# Patient Record
Sex: Female | Born: 2000 | Race: Black or African American | Hispanic: No | Marital: Single | Smoking: Never smoker
Health system: Southern US, Community
[De-identification: ages and names within clinical notes are randomized; demographics above are authoritative.]

## PROBLEM LIST (undated history)

## (undated) ENCOUNTER — Inpatient Hospital Stay (HOSPITAL_COMMUNITY): Payer: Self-pay

## (undated) DIAGNOSIS — D509 Iron deficiency anemia, unspecified: Secondary | ICD-10-CM

## (undated) DIAGNOSIS — Z9109 Other allergy status, other than to drugs and biological substances: Secondary | ICD-10-CM

## (undated) DIAGNOSIS — K5900 Constipation, unspecified: Secondary | ICD-10-CM

## (undated) HISTORY — DX: Iron deficiency anemia, unspecified: D50.9

## (undated) HISTORY — DX: Constipation, unspecified: K59.00

## (undated) HISTORY — PX: NO PAST SURGERIES: SHX2092

---
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
## 2020-01-22 ENCOUNTER — Ambulatory Visit (INDEPENDENT_AMBULATORY_CARE_PROVIDER_SITE_OTHER): Payer: Medicaid Other | Admitting: *Deleted

## 2020-01-22 ENCOUNTER — Other Ambulatory Visit: Payer: Self-pay

## 2020-01-22 DIAGNOSIS — Z3042 Encounter for surveillance of injectable contraceptive: Secondary | ICD-10-CM | POA: Diagnosis not present

## 2020-01-22 MED ORDER — MEDROXYPROGESTERONE ACETATE 150 MG/ML IM SUSP
150.0000 mg | Freq: Once | INTRAMUSCULAR | Status: AC
Start: 2020-01-22 — End: 2020-01-22
  Administered 2020-01-22: 150 mg via INTRAMUSCULAR

## 2020-02-10 NOTE — Progress Notes (Signed)
Depo given at time of visit.

## 2020-02-14 DIAGNOSIS — H52223 Regular astigmatism, bilateral: Secondary | ICD-10-CM | POA: Diagnosis not present

## 2020-02-14 DIAGNOSIS — R519 Headache, unspecified: Secondary | ICD-10-CM | POA: Diagnosis not present

## 2020-02-27 NOTE — Progress Notes (Deleted)
PATIENT: Debra Gray DOB: 14-Jul-2000  REASON FOR VISIT: follow up HISTORY FROM: patient  HISTORY OF PRESENT ILLNESS: Today 02/27/20 [name] is a 19 year old female with history of migraine headache.  She is on amitriptyline 30 mg at bedtime, Maxalt as needed. HISTORY 11/23/2019 Dr. Anne Hahn: [name] is a 19 year old right-handed black female with a history of migraine headaches over the last 2 years.  Her usual frequency is having 1-2 migraine headaches a week, over the last several months she has had an increase in frequency of her headaches having 3-4 headaches a week.  The headaches may last all day long.  The headaches are usually bifrontal in nature associated with photophobia, phonophobia, nausea and vomiting.  She may have some blurring of vision but no loss of vision or bright lights.  She denies any cognitive clouding.  She may feel weak all over.  When she gets the headache she will go lie down and try to rest.  She may take Advil or Tylenol or Excedrin Migraine.  She has tried to cut back on her caffeine intake otherwise.  She indicates that certain odors such as perfume or bright lights may bring on a headache.  The patient denies any neck stiffness, but occasionally she may have some allergy symptoms.  She has a sister with migraine, and her mother used to have migraine headache.  The patient just recently was started on amitriptyline taking 10 mg at night, she tolerates the medication well but it has not helped her headaches.   REVIEW OF SYSTEMS: Out of a complete 14 system review of symptoms, the patient complains only of the following symptoms, and all other reviewed systems are negative.  ALLERGIES: No Known Allergies  HOME MEDICATIONS: Outpatient Medications Prior to Visit  Medication Sig Dispense Refill  . amitriptyline (ELAVIL) 10 MG tablet take 2 tablets at night for one week, then take 3 tablets at night. 90 tablet 3  . rizatriptan (MAXALT) 10 MG tablet  Take 1 tablet (10 mg total) by mouth 3 (three) times daily as needed for migraine. 10 tablet 3   No facility-administered medications prior to visit.    PAST MEDICAL HISTORY: Past Medical History:  Diagnosis Date  . Common migraine with intractable migraine 11/23/2019  . Constipation   . Environmental allergies   . Iron deficiency anemia     PAST SURGICAL HISTORY: No past surgical history on file.  FAMILY HISTORY: Family History  Problem Relation Age of Onset  . Breast cancer Mother        Onset 68s  . Hypertension Father   . Hyperlipidemia Father   . Ovarian cancer Unknown        Maternal great grandmother  . Breast cancer Maternal Grandmother 48  . Colon cancer Unknown        Maternal great uncle    SOCIAL HISTORY: Social History   Socioeconomic History  . Marital status: Significant Other    Spouse name: Not on file  . Number of children: Not on file  . Years of education: Not on file  . Highest education level: Not on file  Occupational History  . Not on file  Tobacco Use  . Smoking status: Passive Smoke Exposure - Never Smoker  . Smokeless tobacco: Never Used  . Tobacco comment: mom smokes inside the home  Substance and Sexual Activity  . Alcohol use: No    Alcohol/week: 0.0 standard drinks  . Drug use: No  . Sexual activity: Yes  Birth control/protection: None  Other Topics Concern  . Not on file  Social History Narrative  . Not on file   Social Determinants of Health   Financial Resource Strain:   . Difficulty of Paying Living Expenses: Not on file  Food Insecurity:   . Worried About Programme researcher, broadcasting/film/video in the Last Year: Not on file  . Ran Out of Food in the Last Year: Not on file  Transportation Needs:   . Lack of Transportation (Medical): Not on file  . Lack of Transportation (Non-Medical): Not on file  Physical Activity:   . Days of Exercise per Week: Not on file  . Minutes of Exercise per Session: Not on file  Stress:   . Feeling of  Stress : Not on file  Social Connections:   . Frequency of Communication with Friends and Family: Not on file  . Frequency of Social Gatherings with Friends and Family: Not on file  . Attends Religious Services: Not on file  . Active Member of Clubs or Organizations: Not on file  . Attends Banker Meetings: Not on file  . Marital Status: Not on file  Intimate Partner Violence:   . Fear of Current or Ex-Partner: Not on file  . Emotionally Abused: Not on file  . Physically Abused: Not on file  . Sexually Abused: Not on file      PHYSICAL EXAM  There were no vitals filed for this visit. There is no height or weight on file to calculate BMI.  Generalized: Well developed, in no acute distress   Neurological examination  Mentation: Alert oriented to time, place, history taking. Follows all commands speech and language fluent Cranial nerve II-XII: Pupils were equal round reactive to light. Extraocular movements were full, visual field were full on confrontational test. Facial sensation and strength were normal. Uvula tongue midline. Head turning and shoulder shrug  were normal and symmetric. Motor: The motor testing reveals 5 over 5 strength of all 4 extremities. Good symmetric motor tone is noted throughout.  Sensory: Sensory testing is intact to soft touch on all 4 extremities. No evidence of extinction is noted.  Coordination: Cerebellar testing reveals good finger-nose-finger and heel-to-shin bilaterally.  Gait and station: Gait is normal. Tandem gait is normal. Romberg is negative. No drift is seen.  Reflexes: Deep tendon reflexes are symmetric and normal bilaterally.   DIAGNOSTIC DATA (LABS, IMAGING, TESTING) - I reviewed patient records, labs, notes, testing and imaging myself where available.  Lab Results  Component Value Date   WBC 5.6 08/07/2015   HGB 12.0 01/29/2019   HCT 36.3 08/07/2015   MCV 83.1 08/07/2015   PLT 415 (H) 08/07/2015      Component Value  Date/Time   NA 136 08/07/2015 0948   K 4.3 08/07/2015 0948   CL 103 08/07/2015 0948   CO2 21 08/07/2015 0948   GLUCOSE 63 (L) 08/07/2015 0948   BUN 8 08/07/2015 0948   CREATININE 0.78 08/07/2015 0948   CALCIUM 9.3 08/07/2015 0948   PROT 7.7 08/07/2015 0948   ALBUMIN 4.5 08/07/2015 0948   AST 18 08/07/2015 0948   ALT 11 08/07/2015 0948   ALKPHOS 80 08/07/2015 0948   BILITOT 0.2 08/07/2015 0948   No results found for: CHOL, HDL, LDLCALC, LDLDIRECT, TRIG, CHOLHDL No results found for: BRAX0N No results found for: VITAMINB12 Lab Results  Component Value Date   TSH 0.92 08/07/2015      ASSESSMENT AND PLAN 19 y.o. year old female  has a past medical history of Common migraine with intractable migraine (11/23/2019), Constipation, Environmental allergies, and Iron deficiency anemia. here with ***   I spent 15 minutes with the patient. 50% of this time was spent   Margie Ege, [place], DNP 02/27/2020, 3:16 PM [facility] [address], [address] [place], [place] [address] [phone]

## 2020-02-28 ENCOUNTER — Ambulatory Visit: Payer: Medicaid Other | Admitting: Neurology

## 2020-02-28 ENCOUNTER — Encounter: Payer: Self-pay | Admitting: Neurology

## 2020-04-08 ENCOUNTER — Other Ambulatory Visit: Payer: Self-pay

## 2020-04-08 ENCOUNTER — Ambulatory Visit (INDEPENDENT_AMBULATORY_CARE_PROVIDER_SITE_OTHER): Payer: Medicaid Other | Admitting: *Deleted

## 2020-04-08 DIAGNOSIS — N921 Excessive and frequent menstruation with irregular cycle: Secondary | ICD-10-CM

## 2020-04-08 DIAGNOSIS — Z3049 Encounter for surveillance of other contraceptives: Secondary | ICD-10-CM | POA: Diagnosis not present

## 2020-04-08 MED ORDER — MEDROXYPROGESTERONE ACETATE 150 MG/ML IM SUSP
150.0000 mg | Freq: Once | INTRAMUSCULAR | Status: AC
  Administered 2020-04-08: 150 mg via INTRAMUSCULAR

## 2020-04-11 NOTE — Progress Notes (Signed)
Depo Only

## 2020-06-30 ENCOUNTER — Ambulatory Visit: Payer: Medicaid Other

## 2020-07-03 ENCOUNTER — Ambulatory Visit (INDEPENDENT_AMBULATORY_CARE_PROVIDER_SITE_OTHER): Payer: Medicaid Other

## 2020-07-03 ENCOUNTER — Other Ambulatory Visit: Payer: Self-pay

## 2020-07-03 DIAGNOSIS — Z3042 Encounter for surveillance of injectable contraceptive: Secondary | ICD-10-CM | POA: Diagnosis not present

## 2020-07-03 MED ORDER — MEDROXYPROGESTERONE ACETATE 150 MG/ML IM SUSP
150.0000 mg | Freq: Once | INTRAMUSCULAR | Status: AC
  Administered 2020-07-03: 150 mg via INTRAMUSCULAR

## 2020-07-03 NOTE — Progress Notes (Signed)
Pt presents for depo injection. Pt within depo window, no urine hcg needed. Injection given, tolerated well. F/u depo injection visit scheduled.   

## 2020-07-23 ENCOUNTER — Telehealth: Payer: Self-pay | Admitting: Pediatrics

## 2020-07-23 NOTE — Telephone Encounter (Signed)
I called and spoke with [facility] about transitioning her primary care to an adult medical provider.  She reports that she is ready to see an adult provider.  I discussed options for adult medical providers.  She agreed that Primary Care at [facility] would be a good option for her.  She will call to schedule an appointment to establish care.

## 2020-07-29 ENCOUNTER — Ambulatory Visit (INDEPENDENT_AMBULATORY_CARE_PROVIDER_SITE_OTHER): Payer: Medicaid Other | Admitting: Family

## 2020-07-29 ENCOUNTER — Other Ambulatory Visit: Payer: Self-pay

## 2020-07-29 VITALS — BP 124/73 | HR 88 | Ht 63.19 in | Wt 118.0 lb

## 2020-07-29 DIAGNOSIS — N644 Mastodynia: Secondary | ICD-10-CM

## 2020-07-29 DIAGNOSIS — Z3042 Encounter for surveillance of injectable contraceptive: Secondary | ICD-10-CM

## 2020-07-29 NOTE — Progress Notes (Signed)
History was provided by the patient.   Debra Gray is a 20 y.o. female who is here for R breast pain.   PCP confirmed? No.    HPI:   -Pain on side of R breast x 2 weeks -No palpable mass -Stopped taking headache meds from Neuro (amitriptyline 30 mg at night, rizatriptan 10 mg TID PRN)  -irregular period with Depo; Nexplanon was removed on 02/2018 -no vision changes, no nipple discharge or skin changes -denies prodrome or systemic symptoms  - no fever, chills, weight loss,   Review of Systems  Constitutional: Negative for chills, fever and malaise/fatigue.  Eyes: Negative for blurred vision, double vision and pain.  Respiratory: Negative for cough and shortness of breath.   Cardiovascular: Negative for palpitations.  Gastrointestinal: Negative for abdominal pain, nausea and vomiting.  Genitourinary: Negative for dysuria.  Musculoskeletal: Negative for joint pain and myalgias.  Skin: Negative for itching and rash.  Neurological: Negative for dizziness, tingling and headaches.  Psychiatric/Behavioral: The patient is not nervous/anxious.      Patient Active Problem List   Diagnosis Date Noted  . Common migraine with intractable migraine 11/23/2019  . Iron deficiency anemia due to chronic blood loss 05/12/2017  . Migraine without aura and without status migrainosus, not intractable 05/12/2017  . 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 Nexplanon insertion 08/07/2015  . Adjustment disorder with depressed mood 08/07/2015    Current Outpatient Medications on File Prior to Visit  Medication Sig Dispense Refill  . amitriptyline (ELAVIL) 10 MG tablet take 2 tablets at night for one week, then take 3 tablets at night. 90 tablet 3  . rizatriptan (MAXALT) 10 MG tablet Take 1 tablet (10 mg total) by mouth 3 (three) times daily as needed for migraine. 10 tablet 3   No current facility-administered medications on file prior to visit.    No Known Allergies  Physical  Exam:    Vitals:   07/29/20 1530  BP: 124/73  Pulse: 88  Weight: 118 lb (53.5 kg)  Height: 5' 3.19" (1.605 m)   Wt Readings from Last 3 Encounters:  07/29/20 118 lb (53.5 kg) (32 %, Z= -0.46)*  11/23/19 110 lb (49.9 kg) (19 %, Z= -0.89)*  10/24/19 105 lb 12.8 oz (48 kg) (12 %, Z= -1.20)*   * Growth percentiles are based on CDC (Girls, 2-20 Years) data.    Blood pressure percentiles are not available for patients who are 18 years or older. No LMP recorded.  Physical Exam Vitals reviewed. Exam conducted with a chaperone present.  Constitutional:      Appearance: Normal appearance. She is not ill-appearing.  HENT:     Head: Normocephalic.     Mouth/Throat:     Pharynx: Oropharynx is clear.  Eyes:     General: No scleral icterus.    Extraocular Movements: Extraocular movements intact.     Pupils: Pupils are equal, round, and reactive to light.  Cardiovascular:     Rate and Rhythm: Normal rate and regular rhythm.     Heart sounds: No murmur heard.   Chest:     Chest wall: No mass, deformity or swelling.  Breasts:     Tanner Score is 5. Breasts are symmetrical.     Right: Tenderness present. No mass, nipple discharge, skin change, axillary adenopathy or supraclavicular adenopathy.     Left: No mass, nipple discharge, skin change, tenderness, axillary adenopathy or supraclavicular adenopathy.     Musculoskeletal:        General:  No swelling. Normal range of motion.     Cervical back: Normal range of motion. No rigidity.  Lymphadenopathy:     Cervical: No cervical adenopathy.     Upper Body:     Right upper body: No supraclavicular, axillary or pectoral adenopathy.     Left upper body: No supraclavicular, axillary or pectoral adenopathy.  Skin:    General: Skin is warm and dry.     Findings: No rash.  Neurological:     General: No focal deficit present.     Mental Status: She is alert and oriented to person, place, and time.  Psychiatric:        Mood and Affect:  Mood normal.      Assessment/Plan: 1. Breast tenderness in female 2. On Depo-Provera for contraception  20 yo female presents for concerns of two week course of R breast tenderness. Her breast exam is normal; normal fibrocystic changes present in both breasts. Mild point tenderness noted on R lateral breast without mass noted. Skin is normal without signs of swelling, warmth, redness. Reassurance given. Return precautions given including worsening symptoms, fever, rash, skin changes. Patient agreeable with plan of care.

## 2020-08-04 ENCOUNTER — Encounter: Payer: Self-pay | Admitting: Family

## 2020-09-16 ENCOUNTER — Other Ambulatory Visit: Payer: Self-pay

## 2020-09-16 ENCOUNTER — Ambulatory Visit (INDEPENDENT_AMBULATORY_CARE_PROVIDER_SITE_OTHER): Payer: Medicaid Other

## 2020-09-16 DIAGNOSIS — Z3042 Encounter for surveillance of injectable contraceptive: Secondary | ICD-10-CM | POA: Diagnosis not present

## 2020-09-16 MED ORDER — MEDROXYPROGESTERONE ACETATE 150 MG/ML IM SUSP
150.0000 mg | Freq: Once | INTRAMUSCULAR | Status: AC
  Administered 2020-09-16: 150 mg via INTRAMUSCULAR

## 2020-09-16 NOTE — Progress Notes (Signed)
Pt presents for depo injection. Pt within depo window, no urine hcg needed. Injection given, tolerated well. F/u depo injection visit scheduled. [place] Citizen, could you send patient message with primary care offices for patient per her request.

## 2020-11-23 DIAGNOSIS — L239 Allergic contact dermatitis, unspecified cause: Secondary | ICD-10-CM | POA: Diagnosis not present

## 2020-12-02 ENCOUNTER — Other Ambulatory Visit: Payer: Self-pay

## 2020-12-02 ENCOUNTER — Ambulatory Visit (INDEPENDENT_AMBULATORY_CARE_PROVIDER_SITE_OTHER): Payer: Medicaid Other

## 2020-12-02 DIAGNOSIS — Z3042 Encounter for surveillance of injectable contraceptive: Secondary | ICD-10-CM | POA: Diagnosis not present

## 2020-12-02 MED ORDER — MEDROXYPROGESTERONE ACETATE 150 MG/ML IM SUSP
150.0000 mg | Freq: Once | INTRAMUSCULAR | Status: AC
  Administered 2020-12-02: 150 mg via INTRAMUSCULAR

## 2020-12-02 NOTE — Progress Notes (Signed)
Pt presents for depo injection. Pt within depo window, no urine hcg needed. Injection given, tolerated well. F/u depo injection visit scheduled.   

## 2020-12-09 ENCOUNTER — Emergency Department (HOSPITAL_COMMUNITY)
Admission: EM | Admit: 2020-12-09 | Discharge: 2020-12-10 | Discharge status: Against Medical Advice | Payer: Medicaid Other | Attending: Physician Assistant | Admitting: Physician Assistant

## 2020-12-09 ENCOUNTER — Encounter (HOSPITAL_COMMUNITY): Payer: Self-pay | Admitting: Emergency Medicine

## 2020-12-09 DIAGNOSIS — R42 Dizziness and giddiness: Secondary | ICD-10-CM | POA: Diagnosis present

## 2020-12-09 DIAGNOSIS — Z5321 Procedure and treatment not carried out due to patient leaving prior to being seen by health care provider: Secondary | ICD-10-CM | POA: Diagnosis not present

## 2020-12-09 DIAGNOSIS — G43909 Migraine, unspecified, not intractable, without status migrainosus: Secondary | ICD-10-CM | POA: Diagnosis not present

## 2020-12-09 DIAGNOSIS — R519 Headache, unspecified: Secondary | ICD-10-CM | POA: Diagnosis not present

## 2020-12-09 DIAGNOSIS — R1010 Upper abdominal pain, unspecified: Secondary | ICD-10-CM | POA: Insufficient documentation

## 2020-12-09 DIAGNOSIS — R1013 Epigastric pain: Secondary | ICD-10-CM | POA: Diagnosis not present

## 2020-12-09 LAB — URINALYSIS, ROUTINE W REFLEX MICROSCOPIC
Bilirubin Urine: NEGATIVE
Glucose, UA: NEGATIVE mg/dL
Hgb urine dipstick: NEGATIVE
Ketones, ur: NEGATIVE mg/dL
Nitrite: NEGATIVE
Protein, ur: NEGATIVE mg/dL
Specific Gravity, Urine: 1.018 (ref 1.005–1.030)
pH: 8 (ref 5.0–8.0)

## 2020-12-09 LAB — COMPREHENSIVE METABOLIC PANEL
ALT: 30 U/L (ref 0–44)
AST: 28 U/L (ref 15–41)
Albumin: 4.3 g/dL (ref 3.5–5.0)
Alkaline Phosphatase: 56 U/L (ref 38–126)
Anion gap: 12 (ref 5–15)
BUN: 8 mg/dL (ref 6–20)
CO2: 24 mmol/L (ref 22–32)
Calcium: 9.2 mg/dL (ref 8.9–10.3)
Chloride: 102 mmol/L (ref 98–111)
Creatinine, Ser: 0.58 mg/dL (ref 0.44–1.00)
GFR, Estimated: >60 mL/min (ref >60)
Glucose, Bld: 103 mg/dL — ABNORMAL HIGH (ref 70–99)
Potassium: 3.9 mmol/L (ref 3.5–5.1)
Sodium: 138 mmol/L (ref 135–145)
Total Bilirubin: 0.3 mg/dL (ref 0.3–1.2)
Total Protein: 7.4 g/dL (ref 6.5–8.1)

## 2020-12-09 LAB — CBC WITH DIFFERENTIAL/PLATELET
Abs Immature Granulocytes: 0.14 10*3/uL — ABNORMAL HIGH (ref 0.00–0.07)
Basophils Absolute: 0.1 10*3/uL (ref 0.0–0.1)
Basophils Relative: 0 %
Eosinophils Absolute: 0.2 10*3/uL (ref 0.0–0.5)
Eosinophils Relative: 2 %
HCT: 37.8 % (ref 36.0–46.0)
Hemoglobin: 12 g/dL (ref 12.0–15.0)
Immature Granulocytes: 1 %
Lymphocytes Relative: 16 %
Lymphs Abs: 2.1 10*3/uL (ref 0.7–4.0)
MCH: 27.3 pg (ref 26.0–34.0)
MCHC: 31.7 g/dL (ref 30.0–36.0)
MCV: 86.1 fL (ref 80.0–100.0)
Monocytes Absolute: 1 10*3/uL (ref 0.1–1.0)
Monocytes Relative: 7 %
Neutro Abs: 9.5 10*3/uL — ABNORMAL HIGH (ref 1.7–7.7)
Neutrophils Relative %: 74 %
Platelets: 309 10*3/uL (ref 150–400)
RBC: 4.39 MIL/uL (ref 3.87–5.11)
RDW: 16.1 % — ABNORMAL HIGH (ref 11.5–15.5)
WBC: 13 10*3/uL — ABNORMAL HIGH (ref 4.0–10.5)
nRBC: 0 % (ref 0.0–0.2)

## 2020-12-09 LAB — LIPASE, BLOOD: Lipase: 29 U/L (ref 11–51)

## 2020-12-09 LAB — I-STAT BETA HCG BLOOD, ED (MC, WL, AP ONLY): I-stat hCG, quantitative: <5 m[IU]/mL (ref <5)

## 2020-12-09 MED ORDER — ONDANSETRON 4 MG PO TBDP
4.0000 mg | ORAL_TABLET | Freq: Once | ORAL | Status: AC
  Administered 2020-12-09: 21:00:00 4 mg via ORAL
  Filled 2020-12-09: qty 1

## 2020-12-09 NOTE — ED Triage Notes (Signed)
Pt from home, c/o a migraine w/ light sensitivity, dizziness and n/v.  She is also c/o upper abdominal pain X1 day.  Hx of migraines.

## 2020-12-09 NOTE — ED Notes (Signed)
Pt left AMA °

## 2020-12-09 NOTE — ED Provider Notes (Signed)
Emergency Medicine Provider Triage Evaluation Note  Debra Gray , a 20 y.o. female  was evaluated in triage.  Pt complains of headache consistent with past migraines, nv, epigastric discomfort, lightheadedness. Was recently on prednisone for skin condition.  Review of Systems  Positive: Headache, nv, lightheaded Negative: Diarrhea, urinary sxs  Physical Exam  BP 117/70 (BP Location: Left Arm)   Pulse 66   Temp 98 F (36.7 C)   Resp 18   SpO2 100%  Gen:   Awake, no distress   Resp:  Normal effort  MSK:   Moves extremities without difficulty  Other:  Mld epigastric ttp, no facial droop, moving all extremities normally  Medical Decision Making  Medically screening exam initiated at 8:35 PM.  Appropriate orders placed.  Debra Gray was informed that the remainder of the evaluation will be completed by another provider, this initial triage assessment does not replace that evaluation, and the importance of remaining in the ED until their evaluation is complete.     Rayne Du 12/09/20 2035    Gerhard Munch, MD 12/09/20 Janetta Hora    Gerhard Munch, MD 12/09/20 [phone]

## 2020-12-11 ENCOUNTER — Telehealth: Payer: Self-pay

## 2020-12-11 NOTE — Telephone Encounter (Signed)
Transition Care Management Follow-up Telephone Call Date of discharge and from where: 12/10/2020 from [facility] How have you been since you were released from the hospital? Pt stated that she is still feeling nauseous Any questions or concerns? No  Items Reviewed: Did the pt receive and understand the discharge instructions provided? Yes  Medications obtained and verified? Yes  Other? No  Any new allergies since your discharge? No  Dietary orders reviewed? No Do you have support at home? Yes   Functional Questionnaire: (I = Independent and D = Dependent) ADLs: I  Bathing/Dressing- I  Meal Prep- I  Eating- I  Maintaining continence- I  Transferring/Ambulation- I  Managing Meds- I   Follow up appointments reviewed:  PCP Hospital f/u appt confirmed? No  Pt is interested in establishing Specialist Hospital f/u appt confirmed? No   Are transportation arrangements needed? No  If their condition worsens, is the pt aware to call PCP or go to the Emergency Dept.? Yes Was the patient provided with contact information for the PCP's office or ED? Yes Was to pt encouraged to call back with questions or concerns? Yes

## 2021-02-17 ENCOUNTER — Ambulatory Visit: Payer: Medicaid Other

## 2021-02-19 ENCOUNTER — Ambulatory Visit: Payer: Medicaid Other

## 2021-02-24 ENCOUNTER — Ambulatory Visit: Payer: Medicaid Other

## 2021-05-25 DIAGNOSIS — Z30013 Encounter for initial prescription of injectable contraceptive: Secondary | ICD-10-CM | POA: Diagnosis not present

## 2021-05-25 DIAGNOSIS — Z3200 Encounter for pregnancy test, result unknown: Secondary | ICD-10-CM | POA: Diagnosis not present

## 2021-05-26 DIAGNOSIS — Z23 Encounter for immunization: Secondary | ICD-10-CM | POA: Diagnosis not present

## 2021-06-17 DIAGNOSIS — Z23 Encounter for immunization: Secondary | ICD-10-CM | POA: Diagnosis not present

## 2021-07-10 ENCOUNTER — Emergency Department (HOSPITAL_COMMUNITY)
Admission: EM | Admit: 2021-07-10 | Discharge: 2021-07-10 | Disposition: A | Discharge status: Home / Self Care | Payer: Medicaid Other | Attending: Emergency Medicine | Admitting: Emergency Medicine

## 2021-07-10 ENCOUNTER — Other Ambulatory Visit: Payer: Self-pay

## 2021-07-10 DIAGNOSIS — R519 Headache, unspecified: Secondary | ICD-10-CM | POA: Insufficient documentation

## 2021-07-10 DIAGNOSIS — N939 Abnormal uterine and vaginal bleeding, unspecified: Secondary | ICD-10-CM | POA: Diagnosis not present

## 2021-07-10 DIAGNOSIS — N390 Urinary tract infection, site not specified: Secondary | ICD-10-CM | POA: Insufficient documentation

## 2021-07-10 LAB — URINALYSIS, ROUTINE W REFLEX MICROSCOPIC
Bilirubin Urine: NEGATIVE
Glucose, UA: NEGATIVE mg/dL
Ketones, ur: NEGATIVE mg/dL
Nitrite: POSITIVE — AB
Protein, ur: 100 mg/dL — AB
RBC / HPF: >50 RBC/hpf — ABNORMAL HIGH (ref 0–5)
Specific Gravity, Urine: 1.017 (ref 1.005–1.030)
WBC, UA: >50 WBC/hpf — ABNORMAL HIGH (ref 0–5)
pH: 6 (ref 5.0–8.0)

## 2021-07-10 LAB — CBC WITH DIFFERENTIAL/PLATELET
Abs Immature Granulocytes: 0.02 10*3/uL (ref 0.00–0.07)
Basophils Absolute: 0.1 10*3/uL (ref 0.0–0.1)
Basophils Relative: 1 %
Eosinophils Absolute: 0.2 10*3/uL (ref 0.0–0.5)
Eosinophils Relative: 4 %
HCT: 32.9 % — ABNORMAL LOW (ref 36.0–46.0)
Hemoglobin: 10.8 g/dL — ABNORMAL LOW (ref 12.0–15.0)
Immature Granulocytes: 0 %
Lymphocytes Relative: 45 %
Lymphs Abs: 2.9 10*3/uL (ref 0.7–4.0)
MCH: 27.7 pg (ref 26.0–34.0)
MCHC: 32.8 g/dL (ref 30.0–36.0)
MCV: 84.4 fL (ref 80.0–100.0)
Monocytes Absolute: 0.4 10*3/uL (ref 0.1–1.0)
Monocytes Relative: 7 %
Neutro Abs: 2.7 10*3/uL (ref 1.7–7.7)
Neutrophils Relative %: 43 %
Platelets: 384 10*3/uL (ref 150–400)
RBC: 3.9 MIL/uL (ref 3.87–5.11)
RDW: 13.6 % (ref 11.5–15.5)
WBC: 6.3 10*3/uL (ref 4.0–10.5)
nRBC: 0 % (ref 0.0–0.2)

## 2021-07-10 LAB — BASIC METABOLIC PANEL
Anion gap: 8 (ref 5–15)
BUN: 10 mg/dL (ref 6–20)
CO2: 24 mmol/L (ref 22–32)
Calcium: 9.2 mg/dL (ref 8.9–10.3)
Chloride: 105 mmol/L (ref 98–111)
Creatinine, Ser: 0.78 mg/dL (ref 0.44–1.00)
GFR, Estimated: >60 mL/min (ref >60)
Glucose, Bld: 87 mg/dL (ref 70–99)
Potassium: 3.8 mmol/L (ref 3.5–5.1)
Sodium: 137 mmol/L (ref 135–145)

## 2021-07-10 LAB — WET PREP, GENITAL
Clue Cells Wet Prep HPF POC: NONE SEEN
Sperm: NONE SEEN
Trich, Wet Prep: NONE SEEN
WBC, Wet Prep HPF POC: >=10 — AB (ref <10)
Yeast Wet Prep HPF POC: NONE SEEN

## 2021-07-10 LAB — I-STAT BETA HCG BLOOD, ED (MC, WL, AP ONLY): I-stat hCG, quantitative: <5 m[IU]/mL (ref <5)

## 2021-07-10 MED ORDER — CEPHALEXIN 500 MG PO CAPS
500.0000 mg | ORAL_CAPSULE | Freq: Three times a day (TID) | ORAL | 0 refills | Status: DC
Start: 2021-07-10 — End: 2021-08-26

## 2021-07-10 NOTE — ED Provider Notes (Signed)
[facility] EMERGENCY DEPARTMENT Provider Note   CSN: [id number] Arrival date & time: 07/10/21  1257     History  Chief Complaint  Patient presents with   Vaginal Bleeding    Debra Gray is a 21 y.o. female.  The history is provided by the patient and medical records. No language interpreter was used.  Vaginal Bleeding  21 year old female significant history of dysfunctional uterine bleeding, iron deficiency anemia due to chronic blood loss, recurrent headache, presenting complaining of vaginal bleeding.  History obtained through patient and through mother who is at bedside.  For the past 4 months patient report having persistent vaginal bleeding.  States her bleeding is heavy usually going through 6-8 pads daily.  She also endorsed intermittent lower abdominal cramping improved with taking naproxen.  Occasionally she will feel fatigue but not always.  For the past 4 days she also noticed some increased urinary frequency and urgency with burning urination.  She denies having fever chest pain shortness of breath productive cough vaginal discharge or rash.  She mention being seen evaluated by multiple providers for her complaint in the past.  States that previously she was on Nexplanon for a few years which did seem to help with the symptom but that was removed several months ago.  Furthermore she has been on several different types of birth control medication to help her with heavy bleeding that helps intermittently.  She recently was seen at a clinic for her bleeding and was started on Megace that she took for about 2 weeks with some improvement but bleeding resumed.  She does not have an OB/GYN at this time as she recently aged out from a previous provider.  She went to see give her clinic for her symptoms yesterday but was told that she may need to come to the ER for blood transfusion due to her heavy bleeding thus prompting this visit.  She denies any trauma.  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 Authorizing Provider  amitriptyline (ELAVIL) 10 MG tablet take 2 tablets at night for one week, then take 3 tablets at night. 11/23/19   Kathrynn Ducking, MD  rizatriptan (MAXALT) 10 MG tablet Take 1 tablet (10 mg total) by mouth 3 (three) times daily as needed for migraine. 11/23/19   Kathrynn Ducking, MD      Allergies    Patient has no known allergies.    Review of Systems   Review of Systems  Genitourinary:  Positive for vaginal bleeding.  All other systems reviewed and are negative.  Physical Exam Updated Vital Signs BP (!) 157/88 (BP Location: Right Arm)    Pulse 85    Temp 98.6 F (37 C) (Oral)    Resp 18    Ht 5' (1.524 m)    Wt 45.4 kg    SpO2 96%    BMI 19.53 kg/m  Physical Exam Vitals and nursing note reviewed.  Constitutional:      General: She is not in acute distress.    Appearance: She is well-developed.  HENT:     Head: Atraumatic.  Eyes:     Conjunctiva/sclera: Conjunctivae normal.  Cardiovascular:     Rate and Rhythm: Normal rate and regular rhythm.     Pulses: Normal pulses.     Heart sounds: Normal heart sounds.  Pulmonary:     Effort: Pulmonary effort is normal.  Breath sounds: No wheezing, rhonchi or rales.  Abdominal:     Palpations: Abdomen is soft.     Tenderness: There is no abdominal tenderness.  Genitourinary:    Comments: Please refer to procedural note Musculoskeletal:     Cervical back: Neck supple.  Skin:    Findings: No rash.  Neurological:     Mental Status: She is alert.  Psychiatric:        Mood and Affect: Mood normal.    ED Results / Procedures / Treatments   Labs (all labs ordered are listed, but only abnormal results are displayed) Labs Reviewed  WET PREP, GENITAL - Abnormal; Notable for the following components:      Result Value   WBC, Wet Prep HPF POC >=10 (*)    All other components within normal  limits  CBC WITH DIFFERENTIAL/PLATELET - Abnormal; Notable for the following components:   Hemoglobin 10.8 (*)    HCT 32.9 (*)    All other components within normal limits  URINALYSIS, ROUTINE W REFLEX MICROSCOPIC - Abnormal; Notable for the following components:   Color, Urine AMBER (*)    APPearance CLOUDY (*)    Hgb urine dipstick LARGE (*)    Protein, ur 100 (*)    Nitrite POSITIVE (*)    Leukocytes,Ua LARGE (*)    RBC / HPF >50 (*)    WBC, UA >50 (*)    Bacteria, UA MANY (*)    All other components within normal limits  BASIC METABOLIC PANEL  I-STAT BETA HCG BLOOD, ED ([facility], [facility], [facility] ONLY)  GC/CHLAMYDIA PROBE AMP ([place]) NOT AT [facility]    EKG None  Radiology No results found.  Procedures Pelvic exam  Date/Time: 07/10/2021 3:42 PM Performed by: Domenic Moras, PA-C Authorized by: Domenic Moras, PA-C  Comments: Pelvic exam performed with permission of pt and female ED RN assist during exam.  External genitalia w/out lesions.  Vaginal vault with normal functional discharge.  Cervix w/out lesions, not friable, GC/Chlamydia and wet prep obtained and sent to lab.  Bimanual exam w/out CMT, uterine or adnexal tenderness       Medications Ordered in ED Medications - No data to display  ED Course/ Medical Decision Making/ A&P                           Medical Decision Making Problems Addressed: Abnormal uterine bleeding (AUB): chronic illness or injury    Details: -Will f/u with OBGYN for further care Acute lower UTI: acute illness or injury    Details: -treat with Keflex  Amount and/or Complexity of Data Reviewed Independent Historian: parent    Details: Mother was able to give additional history External Data Reviewed: labs and notes. Labs: ordered.  Risk Prescription drug management.   BP (!) 157/88 (BP Location: Right Arm)    Pulse 85    Temp 98.6 F (37 C) (Oral)    Resp 18    Ht 5' (1.524 m)    Wt 45.4 kg    SpO2 96%    BMI 19.53 kg/m   3:17 PM This is a  21 year old female with significant history of dysfunctional uterine bleeding who is here with concerns of persistent bleeding and possible needing blood transfusion.  Her symptom has been ongoing for the past 4 months and she has been seen evaluated by multiple providers and received multiple treatments for this without resolution.  She does not have an OB/GYN at this  time.  I have reviewed patient's outside records which include a visit to her pediatrician on 09/16/2020 for Depo injection.  On exam patient is well-appearing resting comfortably in no acute discomfort.  Blood pressure is mildly elevated at 157/88 however she is afebrile and not tachycardic.  She is not hypoxic.  Heart and lung sounds fine, abdomen soft nontender.  Labs was independently reviewed interpreted by me.  Urinalysis obtained showing signs of a urinary tract infection.  She patient does have symptoms of 4 I will treat her with Keflex.  Fortunately her hemoglobin is 10.8 and does not require blood transfusion. It was 12 approximate 7 months ago.  Her electrolyte panels are reassuring, her pregnancy test is negative.  Patient will also need a pelvic exam.  Will screen for potential STI as well.  3:43 PM Pelvic exam performed by me.  No significant bleeding noted.  No concerning vaginal discharge and no evidence of PID.  Resultant wet preps are reassuring.  Patient reports she does have an appointment with an OB/GYN in the near future and request to be discharged.  She is stable for discharge.  She will return if symptoms worsen.        Final Clinical Impression(s) / ED Diagnoses Final diagnoses:  Abnormal uterine bleeding (AUB)  Acute lower UTI    Rx / DC Orders ED Discharge Orders          Ordered    cephALEXin (KEFLEX) 500 MG capsule  3 times daily        07/10/21 1657              Domenic Moras, PA-C 07/10/21 1700    Gareth Morgan, MD 07/11/21 1245

## 2021-07-10 NOTE — ED Triage Notes (Signed)
Pt here POV d/t continuous vaginal bleeding X5 months. Pt endorses increased weakness, fatigue. Pt seen at health dept for same.

## 2021-07-10 NOTE — ED Provider Triage Note (Signed)
Emergency Medicine Provider Triage Evaluation Note  Debra Gray , a 21 y.o. female  was evaluated in triage.  Pt complains of vaginal bleeding for the last 5 months.'s been going through 8-10 pads a day with heavy clots.  She was seen and evaluated by OB/GYN and placed on Depo shot back in December.  She was also started on progesterone which did not slow down her periods.  Now having dizziness, lightheadedness, and generalized weakness.  Was advised by OB/GYN to come the emergency department as there was "nothing else to do."  Review of Systems  Positive:  Negative: See above   Physical Exam  BP (!) 157/88 (BP Location: Right Arm)    Pulse 85    Temp 98.6 F (37 C) (Oral)    Resp 18    Ht 5' (1.524 m)    Wt 45.4 kg    SpO2 96%    BMI 19.53 kg/m  Gen:   Awake, no distress   Resp:  Normal effort  MSK:   Moves extremities without difficulty  Other:    Medical Decision Making  Medically screening exam initiated at 1:14 PM.  Appropriate orders placed.  Debra Gray was informed that the remainder of the evaluation will be completed by another provider, this initial triage assessment does not replace that evaluation, and the importance of remaining in the ED until their evaluation is complete.     Honor Loh [place], [place] 07/10/21 1316

## 2021-07-10 NOTE — ED Notes (Signed)
Patient states she is using 9-12 pads per day and they are fully saturated States over the past several years she has been to several different doctors and tried different medicines to stop her vaginal bleeding however it continues. States she was started on birth control in 12/12 and a different medicine in Jan to take for 14 days that was suppose to stop the bleeding however it didn't states she [facility] Because she  has been seen there several times as well and was told to come to the ED.

## 2021-07-10 NOTE — ED Notes (Signed)
Pt not in the room  ??? Left without discharge papers

## 2021-07-10 NOTE — Discharge Instructions (Addendum)
You have been diagnosed with a UTI.  Take antibiotic as prescribed. Please follow up with your OBGYN as previously scheduled or call [facility] to be evaluated for your abnormal vaginal bleeding.  Fortunately your blood count today is high enough that you do not need blood transfusion.  Return if you have any concerns.

## 2021-07-13 ENCOUNTER — Telehealth: Payer: Self-pay

## 2021-07-13 LAB — GC/CHLAMYDIA PROBE AMP (~~LOC~~) NOT AT ARMC
Chlamydia: NEGATIVE
Comment: NEGATIVE
Comment: NORMAL
Neisseria Gonorrhea: NEGATIVE

## 2021-07-13 NOTE — Telephone Encounter (Signed)
Transition Care Management Follow-up Telephone Call Date of discharge and from where: 07/10/2021 from [facility] How have you been since you were released from the hospital? Pt stated that she is feeling well and the bleeding has stopped. Pt stated that she has started the abx Cephalexin.  Any questions or concerns? No  Items Reviewed: Did the pt receive and understand the discharge instructions provided? Yes  Medications obtained and verified? Yes  Other? No  Any new allergies since your discharge? No  Dietary orders reviewed? No Do you have support at home? Yes   Functional Questionnaire: (I = Independent and D = Dependent) ADLs: I Bathing/Dressing- I Meal Prep- I Eating- I Maintaining continence- I Transferring/Ambulation- I Managing Meds- I  Follow up appointments reviewed:  PCP Hospital f/u appt confirmed? Yes  Establish care with [facility].  Specialist Hospital f/u appt confirmed? No  Pt stated she will call and Scheduled to see [facility] Are transportation arrangements needed? No  If their condition worsens, is the pt aware to call PCP or go to the Emergency Dept.? Yes Was the patient provided with contact information for the PCP's office or ED? Yes Was to pt encouraged to call back with questions or concerns? Yes

## 2021-08-26 ENCOUNTER — Other Ambulatory Visit: Payer: Self-pay | Admitting: Psychiatry

## 2021-08-26 ENCOUNTER — Encounter: Payer: Self-pay | Admitting: Psychiatry

## 2021-08-26 ENCOUNTER — Ambulatory Visit: Payer: Medicaid Other | Admitting: Psychiatry

## 2021-08-26 VITALS — BP 135/80 | HR 109 | Ht 60.0 in | Wt 117.0 lb

## 2021-08-26 DIAGNOSIS — G43119 Migraine with aura, intractable, without status migrainosus: Secondary | ICD-10-CM

## 2021-08-26 MED ORDER — ZOLMITRIPTAN 5 MG PO TABS: 5.0000 mg | ORAL_TABLET | ORAL | 3 refills | Status: DC | PRN

## 2021-08-26 MED ORDER — PROPRANOLOL HCL 20 MG PO TABS: 20.0000 mg | ORAL_TABLET | Freq: Two times a day (BID) | ORAL | 3 refills | Status: AC

## 2021-08-26 MED ORDER — METHYLPREDNISOLONE 4 MG PO TBPK: ORAL_TABLET | ORAL | 0 refills | Status: DC

## 2021-08-26 NOTE — Patient Instructions (Signed)
Short course of steroids to break current headache cycle ? ?Start propranolol 20 mg twice a day for headache prevention ? ?Start Zomig as needed for headache. Take at the onset of migraine. If headache recurs or does not fully resolve, you may take a second dose after 2 hours. Please avoid taking more than 2 days per week  ?

## 2021-08-26 NOTE — Progress Notes (Signed)
? ?Referring:  ?No referring provider defined for this encounter. ? ?PCP: ?Grayce Sessions, NP ? ?Neurology was asked to evaluate Debra Gray, a 21 year old female for a chief complaint of headaches.  Our recommendations of care will be communicated by shared medical record.   ? ?CC:  headaches ? ?HPI:  ?Medical co-morbidities: none ? ?The patient presents for evaluation of headaches which began when she was 21 years old. She previously followed with Dr. Anne Hahn, who prescribed amitriptyline 30 mg QHS and Maxalt 10 mg PRN. She did not find these medications to be helpful. ? ?She has had a headache almost every day for the past 2 weeks. Prior to this would have a headache at least once per week. Headaches are described as frontal or temporal throbbing pain with associated photophobia, phonophobia, nausea, and vomiting. She will see flashing lights and blurred vision prior to her migraines. ? ?Currently she is not prescribed any medications for migraines. She has tried her family member's Imitrex which did help but caused myalgias. She has also tried Aleve and benadryl at home which helped somewhat. ? ?Headache History: ?Onset: 21 years old ?Triggers: none ?Aura: blurry, flashing lights ?Location: unilateral temple, occiput, bifrontal ?Quality/Description: throbbing ?Associated Symptoms: ? Photophobia: yes ? Phonophobia: yes ? Nausea: yes ?Vomiting: yes ?Worse with activity?: yes ?Duration of headaches: hours ? ?Headache days per month: 16 ?Headache free days per month: 14 ? ?Current Treatment: ?Abortive ?Aleve ?Benadryl ? ?Preventative ?none ? ?Prior Therapies                                 ?Amitriptyline 30 mg QHS ?Maxalt 10 mg PRN ?Imitrex - helped but caused side effects ?Benadryl ?Aleve ? ? ?LABS: ?BMP Latest Ref Rng & Units 07/10/2021 12/09/2020 08/07/2015  ?Glucose 70 - 99 mg/dL 87 092(Z) 30(Q)  ?BUN 6 - 20 mg/dL 10 8 8   ?Creatinine 0.44 - 1.00 mg/dL 7.62 2.63  ?Sodium 135 - 145 mmol/L 137 138 136   ?Potassium 3.5 - 5.1 mmol/L 3.8 3.9 4.3  ?Chloride 98 - 111 mmol/L 105 102 103  ?CO2 22 - 32 mmol/L 24 24 21   ?Calcium 8.9 - 10.3 mg/dL 9.2 9.2 9.3  ? ?CBC Latest Ref Rng & Units 07/10/2021 12/09/2020 01/29/2019  ?WBC 4.0 - 10.5 K/uL 6.3 13.0(H) -  ?Hemoglobin 12.0 - 15.0 g/dL 10.8(L) 12.0 12.0  ?Hematocrit 36.0 - 46.0 % 32.9(L) 37.8 -  ?Platelets 150 - 400 K/uL 384 309 -  ? ? ? ?IMAGING:  ?none ? ?Current Outpatient Medications on File Prior to Visit  ?Medication Sig Dispense Refill  ? amitriptyline (ELAVIL) 10 MG tablet take 2 tablets at night for one week, then take 3 tablets at night. (Patient not taking: Reported on 08/26/2021) 90 tablet 3  ? ?No current facility-administered medications on file prior to visit.  ? ? ? ?Allergies: ?No Known Allergies ? ?Family History: ?Migraine or other headaches in the family:  mom, grandmother ?Aneurysms in a first degree relative:  no ?Brain tumors in the family:  no ?Other neurological illness in the family:   no ? ?Past Medical History: ?Past Medical History:  ?Diagnosis Date  ? Common migraine with intractable migraine 11/23/2019  ? Constipation   ? Environmental allergies   ? Iron deficiency anemia   ? ? ?Past Surgical History ?No past surgical history on file. ? ?Social History: ?Social History  ? ?Tobacco Use  ? Smoking  status: Never  ?  Passive exposure: Yes  ? Smokeless tobacco: Never  ? Tobacco comments:  ?  mom smokes inside the home  ?Substance Use Topics  ? Alcohol use: No  ?  Alcohol/week: 0.0 standard drinks  ? Drug use: No  ? ? ?ROS: ?Negative for fevers, chills. Positive for headaches. All other systems reviewed and negative unless stated otherwise in HPI. ? ? ?Physical Exam:  ? ?Vital Signs: ?BP 135/80   Pulse (!) 109   Ht 5' (1.524 m)   Wt 117 lb (53.1 kg)   BMI 22.85 kg/m?  ?GENERAL: well appearing,in no acute distress,alert ?SKIN:  Color, texture, turgor normal. No rashes or lesions ?HEAD:  Normocephalic/atraumatic. ?CV:  RRR ?RESP: Normal respiratory  effort ?MSK: no tenderness to palpation over occiput, neck, or shoulders ? ?NEUROLOGICAL: ?Mental Status: Alert, oriented to person, place and time,Follows commands ?Cranial Nerves: PERRL, visual fields intact to confrontation, extraocular movements intact, facial sensation intact, no facial droop or ptosis, hearing grossly intact, no dysarthria ?Motor: muscle strength 5/5 both upper and lower extremities,no drift, normal tone ?Reflexes: 2+ throughout ?Sensation: intact to light touch all 4 extremities ?Coordination: Finger-to- nose-finger intact bilaterally ?Gait: normal-based ? ? ?IMPRESSION: ?21 year old female without significant medical history who presents for evaluation of migraines. Headaches have worsened since her last visit and she would like to try a new medication regimen. Will start propranolol for migraine prevention and Zomig for rescue. Medrol dosepak prescribed to help break her current migraine cycle. ? ?PLAN: ?-Medrol dosepak to help break current headache cycle ?-Preventive: Start propranolol 20 mg BID ?-Rescue: Start Zomig 5 mg PRN ?-next steps: consider Topamax or CGRP for prevention, gepant for rescue ? ? ?I spent a total of 26 minutes chart reviewing and counseling the patient. Headache education was done. Discussed treatment options including preventive and acute medications. Discussed medication overuse headache and to limit use of acute treatments to no more than 2 days/week or 10 days/month. Discussed medication side effects, adverse reactions and drug interactions. Written educational materials and patient instructions outlining all of the above were given. ? ?Follow-up: 4 months ? ? ?Ocie Doyne, MD ?08/26/2021   ?12:03 PM ? ? ?

## 2021-08-31 ENCOUNTER — Encounter: Payer: Self-pay | Admitting: Psychiatry

## 2021-08-31 ENCOUNTER — Other Ambulatory Visit: Payer: Self-pay | Admitting: Psychiatry

## 2021-08-31 MED ORDER — ELETRIPTAN HYDROBROMIDE 40 MG PO TABS: 40.0000 mg | ORAL_TABLET | ORAL | 2 refills | Status: DC | PRN

## 2021-08-31 NOTE — Telephone Encounter (Signed)
I sent in an rx for Relpax to her pharmacy. She can use that instead of Zomig

## 2021-09-02 ENCOUNTER — Telehealth: Payer: Self-pay | Admitting: *Deleted

## 2021-09-02 NOTE — Telephone Encounter (Signed)
[place] [facility] PA for triptans on MD desk for review, signature.  ?

## 2021-09-02 NOTE — Telephone Encounter (Signed)
[place] [facility] PA for Eletriptan signed, faxed to [facility].  ?

## 2021-09-03 NOTE — Telephone Encounter (Signed)
Eletriptan Approved through 09/04/2022. ?

## 2021-09-03 NOTE — Telephone Encounter (Addendum)
Called [place] [facility], spoke with Lana who would not give PA status without [place] ID. Per [place] [facility] portal, Eletriptan was denied. Patient failed the 2 preferred. Called [facility], spoke with pharmacist and got insurnace info:  ?[employer], bin [phone], pcn [id number], group acunc, Id [id number] t  ? ?Started PA on CMM, key [id number]. Faxed office notes to be attached.  ?

## 2021-09-03 NOTE — Telephone Encounter (Signed)
Received e mail: documents attached to key. Your information has been sent to [facility]. 

## 2021-09-04 ENCOUNTER — Other Ambulatory Visit: Payer: Self-pay

## 2021-09-04 ENCOUNTER — Ambulatory Visit (INDEPENDENT_AMBULATORY_CARE_PROVIDER_SITE_OTHER): Payer: Medicaid Other | Admitting: Primary Care

## 2021-09-04 ENCOUNTER — Encounter (INDEPENDENT_AMBULATORY_CARE_PROVIDER_SITE_OTHER): Payer: Self-pay | Admitting: Primary Care

## 2021-09-04 VITALS — BP 113/75 | HR 86 | Temp 98.3°F | Ht 62.0 in | Wt 118.0 lb

## 2021-09-04 DIAGNOSIS — D500 Iron deficiency anemia secondary to blood loss (chronic): Secondary | ICD-10-CM

## 2021-09-04 DIAGNOSIS — G43009 Migraine without aura, not intractable, without status migrainosus: Secondary | ICD-10-CM | POA: Diagnosis not present

## 2021-09-04 DIAGNOSIS — N921 Excessive and frequent menstruation with irregular cycle: Secondary | ICD-10-CM

## 2021-09-04 DIAGNOSIS — Z7689 Persons encountering health services in other specified circumstances: Secondary | ICD-10-CM | POA: Diagnosis not present

## 2021-09-04 NOTE — Progress Notes (Signed)
? ?New Patient Office Visit ? ?Subjective:  ?Patient ID: Debra Gray, female    DOB: 03-Aug-2000  Age: 21 y.o. MRN: [id number] ? ?CC:  ?Chief Complaint  ?Patient presents with  ? New Patient (Initial Visit)  ? ? ?HPI ?Debra Gray is a 21 year old female  presents for establishment of care. She is not sexually active with female. She does suffer from migraines and is followed by neurology. Patient has No chest pain, No abdominal pain - No Nausea, No new weakness tingling or numbness, No Cough -or shortness of breath. Note strong hx of breast cancer Maternal 3 generation was told not to take anything with estrogen from pediatrician.  ? ?Past Medical History:  ?Diagnosis Date  ? Common migraine with intractable migraine 11/23/2019  ? Constipation   ? Environmental allergies   ? Iron deficiency anemia   ? ? ?No past surgical history on file. ? ?Family History  ?Problem Relation Age of Onset  ? Breast cancer Mother   ?     Onset 40s  ? Hypertension Father   ? Hyperlipidemia Father   ? Ovarian cancer Unknown   ?     Maternal great grandmother  ? Breast cancer Maternal Grandmother 48  ? Colon cancer Unknown   ?     Maternal great uncle  ? ? ?Social History  ? ?Socioeconomic History  ? Marital status: Single  ?  Spouse name: Not on file  ? Number of children: Not on file  ? Years of education: Not on file  ? Highest education level: Not on file  ?Occupational History  ? Not on file  ?Tobacco Use  ? Smoking status: Never  ?  Passive exposure: Yes  ? Smokeless tobacco: Never  ? Tobacco comments:  ?  mom smokes inside the home  ?Substance and Sexual Activity  ? Alcohol use: No  ?  Alcohol/week: 0.0 standard drinks  ? Drug use: No  ? Sexual activity: Yes  ?  Birth control/protection: None  ?Other Topics Concern  ? Not on file  ?Social History Narrative  ? Not on file  ? ?Social Determinants of Health  ? ?Financial Resource Strain: Not on file  ?Food Insecurity: Not on file  ?Transportation Needs: Not on  file  ?Physical Activity: Not on file  ?Stress: Not on file  ?Social Connections: Not on file  ?Intimate Partner Violence: Not on file  ? ? ?ROS ?Comprehensive ROS Pertinent positive and negative noted in HPI   ?Objective:  ? ?Today's Vitals: BP 113/75 (BP Location: Right Arm, Patient Position: Sitting, Cuff Size: Normal)   Pulse 86   Temp 98.3 ?F (36.8 ?C) (Oral)   Ht 5\' 2"  (1.575 m)   Wt 118 lb (53.5 kg)   LMP 07/25/2021 Comment: menstrual began feb 11 and jsut recently ended  SpO2 97%   BMI 21.58 kg/m?   ?General: No apparent distress. ?Eyes: Extraocular eye movements intact, pupils equal and round. ?Neck: Supple, trachea midline. ?Thyroid: No enlargement, mobile without fixation, no tenderness. ?Cardiovascular: Regular rhythm and rate, no murmur, normal radial pulses. ?Respiratory: Normal respiratory effort, clear to auscultation. ?Gastrointestinal: Normal pitch active bowel sounds, nontender abdomen without distention or appreciable hepatomegaly. ?Neurologic: Alert, conversant, speech clear, thought logical, appropriate mood and affect, no hallucinations or delusions evident. ?Musculoskeletal: Normal muscle tone, no tenderness on palpation of tibia, no excessive thoracic kyphosis. ?Skin: Appropriate warmth, no visible rash. ?Hematologic/lymphatic: No cervical adenopathy, no visible ecchymoses.  ?Assessment & Plan:  ?Debra Gray  was seen today for new patient (initial visit). ? ?Diagnoses and all orders for this visit: ? ?Encounter to establish care ?Establish care ? ?Migraine without aura and without status migrainosus, not intractable ?Followed by neurology  ? ?Menorrhagia with irregular cycle ?Acute blood loss with heavy painful irregular menstruation primary cause of anemia   ? ?Iron deficiency anemia due to chronic blood loss ?-     CBC with Differential/Platelet; Future ? ?Outpatient Encounter Medications as of 09/04/2021  ?Medication Sig  ? eletriptan (RELPAX) 40 MG tablet PLEASE SEE ATTACHED FOR  DETAILED DIRECTIONS  ? propranolol (INDERAL) 20 MG tablet Take 1 tablet (20 mg total) by mouth 2 (two) times daily.  ? [DISCONTINUED] amitriptyline (ELAVIL) 10 MG tablet take 2 tablets at night for one week, then take 3 tablets at night. (Patient not taking: Reported on 08/26/2021)  ? [DISCONTINUED] methylPREDNISolone (MEDROL DOSEPAK) 4 MG TBPK tablet Take as directed by packaging  ? ?No facility-administered encounter medications on file as of 09/04/2021.  ? ?Debra Gray was seen today for new patient (initial visit). ? ?Diagnoses and all orders for this visit: ? ?Encounter to establish care ?Establish care  ? ?Migraine without aura and without status migrainosus, not intractable ?Followed by neurology  ? ?Menorrhagia with irregular cycle ?With anemia from chronic blood loss. ?Debra Gray was seen today for new patient (initial visit). ? ?Diagnoses and all orders for this visit: ? ?Encounter to establish care ? ?Migraine without aura and without status migrainosus, not intractable ? ?Menorrhagia with irregular cycle ? ?Iron deficiency anemia due to chronic blood loss ?-     CBC with Differential/Platelet; Future ? ?  ?  ? ?Follow-up: Return in about 3 months (around 12/05/2021) for anemia.  ? ?Debra Sessions, NP ? ?

## 2021-09-04 NOTE — Patient Instructions (Signed)
Anemia ?Anemia is a condition in which there is not enough red blood cells or hemoglobin in the blood. Hemoglobin is a substance in red blood cells that carries oxygen. ?When you do not have enough red blood cells or hemoglobin (are anemic), your body cannot get enough oxygen and your organs may not work properly. As a result, you may feel very tired or have other problems. ?What are the causes? ?Common causes of anemia include: ?Excessive bleeding. Anemia can be caused by excessive bleeding inside or outside the body, including bleeding from the intestines or from heavy menstrual periods in females. ?Poor nutrition. ?Long-lasting (chronic) kidney, thyroid, and liver disease. ?Bone marrow disorders, spleen problems, and blood disorders. ?Cancer and treatments for cancer. ?HIV (human immunodeficiency virus) and AIDS (acquired immunodeficiency syndrome). ?Infections, medicines, and autoimmune disorders that destroy red blood cells. ?What are the signs or symptoms? ?Symptoms of this condition include: ?Minor weakness. ?Dizziness. ?Headache, or difficulties concentrating and sleeping. ?Heartbeats that feel irregular or faster than normal (palpitations). ?Shortness of breath, especially with exercise. ?Pale skin, lips, and nails, or cold hands and feet. ?Indigestion and nausea. ?Symptoms may occur suddenly or develop slowly. If your anemia is mild, you may not have symptoms. ?How is this diagnosed? ?This condition is diagnosed based on blood tests, your medical history, and a physical exam. In some cases, a test may be needed in which cells are removed from the soft tissue inside of a bone and looked at under a microscope (bone marrow biopsy). Your health care provider may also check your stool (feces) for blood and may do additional testing to look for the cause of your bleeding. ?Other tests may include: ?Imaging tests, such as a CT scan or MRI. ?A procedure to see inside your esophagus and stomach (endoscopy). ?A  procedure to see inside your colon and rectum (colonoscopy). ?How is this treated? ?Treatment for this condition depends on the cause. If you continue to lose a lot of blood, you may need to be treated at a hospital. Treatment may include: ?Taking supplements of iron, vitamin E09, or folic acid. ?Taking a hormone medicine (erythropoietin) that can help to stimulate red blood cell growth. ?Having a blood transfusion. This may be needed if you lose a lot of blood. ?Making changes to your diet. ?Having surgery to remove your spleen. ?Follow these instructions at home: ?Take over-the-counter and prescription medicines only as told by your health care provider. ?Take supplements only as told by your health care provider. ?Follow any diet instructions that you were given by your health care provider. ?Keep all follow-up visits as told by your health care provider. This is important. ?Contact a health care provider if: ?You develop new bleeding anywhere in the body. ?Get help right away if: ?You are very weak. ?You are short of breath. ?You have pain in your abdomen or chest. ?You are dizzy or feel faint. ?You have trouble concentrating. ?You have bloody stools, black stools, or tarry stools. ?You vomit repeatedly or you vomit up blood. ?These symptoms may represent a serious problem that is an emergency. Do not wait to see if the symptoms will go away. Get medical help right away. Call your local emergency services (911 in the [place] Do not drive yourself to the hospital. ?Summary ?Anemia is a condition in which you do not have enough red blood cells or enough of a substance in your red blood cells that carries oxygen (hemoglobin). ?Symptoms may occur suddenly or develop slowly. ?If your anemia is  mild, you may not have symptoms. ?This condition is diagnosed with blood tests, a medical history, and a physical exam. Other tests may be needed. ?Treatment for this condition depends on the cause of the anemia. ?This  information is not intended to replace advice given to you by your health care provider. Make sure you discuss any questions you have with your health care provider. ?Document Revised: 05/08/2019 Document Reviewed: 05/08/2019 ?Elsevier Patient Education ? 2022 [employer] ? ?

## 2021-11-16 ENCOUNTER — Encounter: Payer: Self-pay | Admitting: Psychiatry

## 2021-12-07 ENCOUNTER — Ambulatory Visit (INDEPENDENT_AMBULATORY_CARE_PROVIDER_SITE_OTHER): Payer: Medicaid Other | Admitting: Primary Care

## 2021-12-09 ENCOUNTER — Ambulatory Visit (INDEPENDENT_AMBULATORY_CARE_PROVIDER_SITE_OTHER): Payer: Medicaid Other | Admitting: Primary Care

## 2021-12-09 ENCOUNTER — Encounter (INDEPENDENT_AMBULATORY_CARE_PROVIDER_SITE_OTHER): Payer: Self-pay | Admitting: Primary Care

## 2021-12-09 VITALS — BP 124/77 | HR 88 | Temp 98.6°F | Ht 62.0 in | Wt 126.0 lb

## 2021-12-09 DIAGNOSIS — D500 Iron deficiency anemia secondary to blood loss (chronic): Secondary | ICD-10-CM | POA: Diagnosis not present

## 2021-12-09 DIAGNOSIS — Z23 Encounter for immunization: Secondary | ICD-10-CM

## 2021-12-10 LAB — CBC WITH DIFFERENTIAL/PLATELET
Basophils Absolute: 0.1 x10E3/uL (ref 0.0–0.2)
Basos: 1 %
EOS (ABSOLUTE): 0.2 x10E3/uL (ref 0.0–0.4)
Eos: 3 %
Hematocrit: 31.6 % — ABNORMAL LOW (ref 34.0–46.6)
Hemoglobin: 9.9 g/dL — ABNORMAL LOW (ref 11.1–15.9)
Immature Grans (Abs): 0 x10E3/uL (ref 0.0–0.1)
Immature Granulocytes: 0 %
Lymphocytes Absolute: 3 x10E3/uL (ref 0.7–3.1)
Lymphs: 32 %
MCH: 26.2 pg — ABNORMAL LOW (ref 26.6–33.0)
MCHC: 31.3 g/dL — ABNORMAL LOW (ref 31.5–35.7)
MCV: 84 fL (ref 79–97)
Monocytes Absolute: 0.6 x10E3/uL (ref 0.1–0.9)
Monocytes: 6 %
Neutrophils Absolute: 5.5 x10E3/uL (ref 1.4–7.0)
Neutrophils: 58 %
Platelets: 406 x10E3/uL (ref 150–450)
RBC: 3.78 x10E6/uL (ref 3.77–5.28)
RDW: 13.2 % (ref 11.7–15.4)
WBC: 9.4 x10E3/uL (ref 3.4–10.8)

## 2021-12-11 ENCOUNTER — Encounter (HOSPITAL_COMMUNITY): Payer: Self-pay

## 2021-12-11 ENCOUNTER — Ambulatory Visit (INDEPENDENT_AMBULATORY_CARE_PROVIDER_SITE_OTHER): Payer: Medicaid Other

## 2021-12-11 ENCOUNTER — Ambulatory Visit (HOSPITAL_COMMUNITY)
Admission: RE | Admit: 2021-12-11 | Discharge: 2021-12-11 | Disposition: A | Discharge status: Home / Self Care | Payer: Medicaid Other | Source: Ambulatory Visit | Attending: Emergency Medicine | Admitting: Emergency Medicine

## 2021-12-11 VITALS — BP 122/80 | HR 87 | Temp 98.4°F | Resp 14

## 2021-12-11 DIAGNOSIS — R1030 Lower abdominal pain, unspecified: Secondary | ICD-10-CM | POA: Diagnosis not present

## 2021-12-11 DIAGNOSIS — M545 Low back pain, unspecified: Secondary | ICD-10-CM

## 2021-12-11 LAB — POCT URINALYSIS DIPSTICK, ED / UC
Bilirubin Urine: NEGATIVE
Glucose, UA: NEGATIVE mg/dL
Ketones, ur: NEGATIVE mg/dL
Nitrite: NEGATIVE
Protein, ur: NEGATIVE mg/dL
Specific Gravity, Urine: 1.015 (ref 1.005–1.030)
Urobilinogen, UA: 0.2 mg/dL (ref 0.0–1.0)
pH: 5.5 (ref 5.0–8.0)

## 2021-12-11 MED ORDER — IBUPROFEN 800 MG PO TABS
ORAL_TABLET | ORAL | Status: AC
  Filled 2021-12-11: qty 1

## 2021-12-11 MED ORDER — IBUPROFEN 800 MG PO TABS
800.0000 mg | ORAL_TABLET | Freq: Once | ORAL | Status: AC
  Administered 2021-12-11: 800 mg via ORAL

## 2021-12-11 MED ORDER — IBUPROFEN 600 MG PO TABS: 600.0000 mg | ORAL_TABLET | Freq: Four times a day (QID) | ORAL | 0 refills | Status: DC | PRN

## 2021-12-11 MED ORDER — CYCLOBENZAPRINE HCL 10 MG PO TABS
10.0000 mg | ORAL_TABLET | Freq: Two times a day (BID) | ORAL | 0 refills | Status: AC | PRN
Start: 2021-12-11 — End: ?

## 2021-12-11 NOTE — Discharge Instructions (Addendum)
I recommend ibuprofen every 6 hours for pain.  You can keep trying hot pad to the back. I have also sent in a muscle relaxer for you to try in addition to the ibuprofen.  You can take it twice a day, but if this medicine makes you drowsy please only take at nighttime.  The orthopedic clinic contact information is below.  You can follow-up with them if your symptoms are persistent.  Please go to the emergency department if you have any worsening symptoms.  Once symptoms are under control, I recommend stool softener such as miralax to help with emptying the colon.

## 2021-12-11 NOTE — ED Provider Notes (Signed)
[facility]    CSN: [id number] Arrival date & time: 12/11/21  1607     History   Chief Complaint Chief Complaint  Patient presents with   Back Pain   Abdominal Pain    HPI Debra Gray is a 21 y.o. female.  Presents with 3-day history of low back pain and abdominal pain.  Reports the abdominal pain is her lower abdomen and 6/10 pain.  Back pain is along the lumbar spine and worse with movement.  She tried hot pad this morning that worked for a little but then pain became very severe.  She has not tried any ibuprofen.  Denies any injury or trauma to the back.  No history of back problems.   Reports 1 episode of vomiting yesterday with some nausea.  Denies any urinary symptoms such as dysuria, urgency, frequency, hematuria.  Denies any vaginal discharge, odor, rash, irritation, heavy bleeding.  Last bowel movement was yesterday.  Denies diarrhea or constipation. Does have history of constipation. Denies nausea in clinic today.  Last menstrual cycle was 6/23.  Reports the abdominal cramping she has does not feel like her usual cycle cramping.  No history of ovarian cysts. Per chart review history of menorrhagia with AUB, irregular cycles. She is sexually active and does not use barrier methods. No known exposure to STDs.  Denies fevers, chills, shortness of breath, chest pain, weakness, numbness or tingling. No known sick contacts.  Past Medical History:  Diagnosis Date   Common migraine with intractable migraine 11/23/2019   Constipation    Environmental allergies    Iron deficiency anemia     Patient Active Problem List   Diagnosis Date Noted   Common migraine with intractable migraine 11/23/2019   Iron deficiency anemia due to chronic blood loss 05/12/2017   Migraine without aura and without status migrainosus, not intractable 05/12/2017   Dysfunctional uterine bleeding 08/07/2015   Nexplanon insertion 08/07/2015   Adjustment disorder with depressed mood  08/07/2015    History reviewed. No pertinent surgical history.  OB History   No obstetric history on file.      Home Medications    Prior to Admission medications   Medication Sig Start Date End Date Taking? Authorizing Provider  cyclobenzaprine (FLEXERIL) 10 MG tablet Take 1 tablet (10 mg total) by mouth 2 (two) times daily as needed for muscle spasms. 12/11/21  Yes Veryl Abril, Lurena Joiner, PA-C  ibuprofen (ADVIL) 600 MG tablet Take 1 tablet (600 mg total) by mouth every 6 (six) hours as needed for moderate pain or mild pain. 12/11/21  Yes Francessca Friis, Lurena Joiner, PA-C  eletriptan (RELPAX) 40 MG tablet PLEASE SEE ATTACHED FOR DETAILED DIRECTIONS 09/03/21   Ocie Doyne, MD  propranolol (INDERAL) 20 MG tablet Take 1 tablet (20 mg total) by mouth 2 (two) times daily. 08/26/21   Ocie Doyne, MD    Family History Family History  Problem Relation Age of Onset   Breast cancer Mother        Onset 32s   Hypertension Father    Hyperlipidemia Father    Ovarian cancer Unknown        Maternal great grandmother   Breast cancer Maternal Grandmother 48   Colon cancer Unknown        Maternal great uncle    Social History Social History   Tobacco Use   Smoking status: Never    Passive exposure: Yes   Smokeless tobacco: Never   Tobacco comments:    mom smokes inside the  home  Substance Use Topics   Alcohol use: No    Alcohol/week: 0.0 standard drinks of alcohol   Drug use: No     Allergies   Patient has no known allergies.   Review of Systems Review of Systems  Gastrointestinal:  Positive for abdominal pain.  Musculoskeletal:  Positive for back pain.   Per HPI  Physical Exam Triage Vital Signs ED Triage Vitals  Enc Vitals Group     BP 12/11/21 1641 122/80     Pulse Rate 12/11/21 1641 87     Resp 12/11/21 1641 14     Temp 12/11/21 1641 98.4 F (36.9 C)     Temp Source 12/11/21 1641 Oral     SpO2 12/11/21 1641 99 %     Weight --      Height --      Head Circumference --       Peak Flow --      Pain Score 12/11/21 1640 8     Pain Loc --      Pain Edu? --      Excl. in GC? --    No data found.  Updated Vital Signs BP 122/80 (BP Location: Left Arm)   Pulse 87   Temp 98.4 F (36.9 C) (Oral)   Resp 14   LMP 12/04/2021 (Approximate)   SpO2 99%    Physical Exam Vitals and nursing note reviewed.  Constitutional:      General: She is not in acute distress. HENT:     Nose: Nose normal.     Mouth/Throat:     Mouth: Mucous membranes are moist.     Pharynx: Oropharynx is clear.  Eyes:     Extraocular Movements: Extraocular movements intact.     Conjunctiva/sclera: Conjunctivae normal.     Pupils: Pupils are equal, round, and reactive to light.  Cardiovascular:     Rate and Rhythm: Normal rate and regular rhythm.     Heart sounds: Normal heart sounds.  Pulmonary:     Effort: Pulmonary effort is normal.     Breath sounds: Normal breath sounds.  Abdominal:     Palpations: Abdomen is soft.     Tenderness: There is abdominal tenderness in the right lower quadrant and left lower quadrant. There is no guarding or rebound. Negative signs include Rovsing's sign and McBurney's sign.  Musculoskeletal:        General: Normal range of motion.     Lumbar back: Tenderness and bony tenderness present. No deformity or signs of trauma. Normal range of motion.     Comments: Pain with palpation of lumbar spine and paraspinals  Neurological:     Mental Status: She is alert and oriented to person, place, and time.     Sensory: Sensation is intact.     Motor: Motor function is intact.     Coordination: Coordination is intact.     Gait: Gait is intact.  Psychiatric:        Mood and Affect: Affect is tearful.     UC Treatments / Results  Labs (all labs ordered are listed, but only abnormal results are displayed) Labs Reviewed  POCT URINALYSIS DIPSTICK, ED / UC - Abnormal; Notable for the following components:      Result Value   Hgb urine dipstick SMALL (*)     Leukocytes,Ua TRACE (*)    All other components within normal limits  URINE CULTURE  CERVICOVAGINAL ANCILLARY ONLY    EKG  Radiology DG Lumbar  Spine 2-3 Views  Result Date: 12/11/2021 CLINICAL DATA:  Back pain EXAM: LUMBAR SPINE - 2-3 VIEW COMPARISON:  None Available. FINDINGS: There is no evidence of lumbar spine fracture. Alignment is normal. Intervertebral disc spaces are maintained. IMPRESSION: Negative. Electronically Signed   By: Jasmine Pang M.D.   On: 12/11/2021 18:34    Procedures Procedures (including critical care time)  Medications Ordered in UC Medications  ibuprofen (ADVIL) tablet 800 mg (800 mg Oral Given 12/11/21 1751)    Initial Impression / Assessment and Plan / UC Course  I have reviewed the triage vital signs and the nursing notes.  Pertinent labs & imaging results that were available during my care of the patient were reviewed by me and considered in my medical decision making (see chart for details).  Urinalysis unremarkable apart from trace leuks and small hemoglobin.  Patient denies any urinary symptoms but will send for culture. Vaginal swab pending.  Ibuprofen dose given with improvement of patient back pain. Went from 10/10 to 4/10. Overall appears much better. Abdominal pain down to 4/10 as well.  Lumbar spine x-ray negative per radiology report. On interpretation I do note large stool burden of the colon. Rugae noted. Low concern for toxic megacolon or obstruction. Patient is having BMs and able to pass gas. Monitor for worsening symptoms. Miralax for colon clean out.  Ibuprofen and muscle relaxer combo.  Orthopedic clinic follow-up if symptoms are persistent.  Emergency department if symptoms worsen. Return precautions discussed. Patient agrees to plan and is discharged in stable condition.  Final Clinical Impressions(s) / UC Diagnoses   Final diagnoses:  Acute midline low back pain without sciatica  Lower abdominal pain     Discharge  Instructions      I recommend ibuprofen every 6 hours for pain.  You can keep trying hot pad to the back. I have also sent in a muscle relaxer for you to try in addition to the ibuprofen.  You can take it twice a day, but if this medicine makes you drowsy please only take at nighttime.  The orthopedic clinic contact information is below.  You can follow-up with them if your symptoms are persistent.  Please go to the emergency department if you have any worsening symptoms.  Once symptoms are under control, I recommend stool softener such as miralax to help with emptying the colon.     ED Prescriptions     Medication Sig Dispense Auth. Provider   ibuprofen (ADVIL) 600 MG tablet Take 1 tablet (600 mg total) by mouth every 6 (six) hours as needed for moderate pain or mild pain. 30 tablet Floye Fesler, PA-C   cyclobenzaprine (FLEXERIL) 10 MG tablet Take 1 tablet (10 mg total) by mouth 2 (two) times daily as needed for muscle spasms. 20 tablet Hikeem Andersson, Lurena Joiner, PA-C      PDMP not reviewed this encounter.   Kathrine Haddock 12/11/21 1906

## 2021-12-11 NOTE — ED Triage Notes (Signed)
Pt reports lower back pains and lower abd pains x 3 days. Denies falls or injury. Had some vomiting. Denies urinary or bowel problems.

## 2021-12-14 LAB — CERVICOVAGINAL ANCILLARY ONLY
Bacterial Vaginitis (gardnerella): NEGATIVE
Candida Glabrata: NEGATIVE
Candida Vaginitis: NEGATIVE
Chlamydia: NEGATIVE
Comment: NEGATIVE
Comment: NEGATIVE
Comment: NEGATIVE
Comment: NEGATIVE
Comment: NEGATIVE
Comment: NORMAL
Neisseria Gonorrhea: NEGATIVE
Trichomonas: NEGATIVE

## 2021-12-14 LAB — URINE CULTURE: Culture: >=100000 — AB

## 2021-12-15 NOTE — Progress Notes (Signed)
     [facility]   Subjective:     Debra Gray is a 21 y.o. female who presents for evaluation of anemia. Anemia was found by routine CBC.  It has been present for 5  years .  Associated signs & symptoms: fatigue. The following portions of the patient's history were reviewed and updated as appropriate: allergies, current medications, past family history, past medical history, past social history, past surgical history, and problem list. Review of Systems Pertinent items are noted in HPI.       Objective:    BP 124/77   Pulse 88   Temp 98.6 F (37 C) (Oral)   Ht 5\' 2"  (1.575 m)   Wt 126 lb (57.2 kg)   LMP 12/04/2021 (Approximate)   SpO2 100%   BMI 23.05 kg/m  General appearance: alert, appears stated age, and no distress Eyes: conjunctivae/corneas clear. PERRL, EOM's intact. Fundi benign. Ears: normal TM's and external ear canals both ears Lungs: clear to auscultation bilaterally Heart: regular rate and rhythm, S1, S2 normal, no murmur, click, rub or gallop Abdomen: soft, non-tender; bowel sounds normal; no masses,  no organomegaly Extremities: extremities normal, atraumatic, no cyanosis or edema Skin: Skin color, texture, turgor normal. No rashes or lesions Lymph nodes: Cervical, supraclavicular, and axillary nodes normal.    Assessment:  Debra Gray was seen today for follow-up.  Diagnoses and all orders for this visit:  Need for Tdap vaccination -     Tdap vaccine greater than or equal to 7yo IM  Iron deficiency anemia due to chronic blood loss Iron rich foods such as shellfish,liver, organ meats(liver, gizzard), and red meats can increase cholesterol and should be consumed in moderation.However; legumes(beans), spinach, pumpkin seeds, 12/06/2021, broccoli, tofu, green leafy vegetables and dark chocolate can be consumed without concern to cholesterol.   -     CBC with Differential/Platelet  This note has been created with [place]. Any transcriptional errors are unintentional.   Furniture conservator/restorer, NP 12/15/2021, 11:09 PM

## 2021-12-31 ENCOUNTER — Ambulatory Visit: Payer: Medicaid Other | Admitting: Psychiatry

## 2021-12-31 ENCOUNTER — Encounter: Payer: Self-pay | Admitting: Psychiatry

## 2022-01-20 ENCOUNTER — Ambulatory Visit: Payer: Medicaid Other | Admitting: Psychiatry

## 2022-01-20 NOTE — Progress Notes (Deleted)
   CC:  headaches  Follow-up Visit  Last visit: 08/26/21  Brief HPI: 21 year old female without significant medical history who follows in clinic for migraines.  At her last visit she was started on propranolol for prevention and eletriptan for rescue. Interval History: ***   Headache days per month: *** Headache free days per month: *** Headache severity: ***  Current Headache Regimen: Preventative: *** Abortive: ***  # of doses of abortive medications per month: ***  Prior Therapies                                  Amitriptyline 30 mg QHS propranolol Maxalt 10 mg PRN Relpax 40 mg PRN Imitrex - helped but caused side effects Benadryl Aleve  Physical Exam:   Vital Signs: There were no vitals taken for this visit. GENERAL:  well appearing, in no acute distress, alert  SKIN:  Color, texture, turgor normal. No rashes or lesions HEAD:  Normocephalic/atraumatic. RESP: normal respiratory effort MSK:  No gross joint deformities.   NEUROLOGICAL: Mental Status: Alert, oriented to person, place and time, Follows commands, and Speech fluent and appropriate. Cranial Nerves: PERRL, face symmetric, no dysarthria, hearing grossly intact Motor: moves all extremities equally Gait: normal-based.  IMPRESSION: ***  PLAN: ***   Follow-up: ***  I spent a total of *** minutes on the date of the service. Headache education was done. Discussed lifestyle modification including increased oral hydration, decreased caffeine, exercise and stress management. Discussed treatment options including preventive and acute medications, natural supplements, and infusion therapy. Discussed medication overuse headache and to limit use of acute treatments to no more than 2 days/week or 10 days/month. Discussed medication side effects, adverse reactions and drug interactions. Written educational materials and patient instructions outlining all of the above were given.  Ocie Doyne, MD

## 2022-01-27 ENCOUNTER — Encounter: Payer: Self-pay | Admitting: Psychiatry

## 2022-01-27 ENCOUNTER — Ambulatory Visit: Payer: Medicaid Other | Admitting: Psychiatry

## 2022-01-27 NOTE — Progress Notes (Deleted)
   CC:  headaches  Follow-up Visit  Last visit: 08/26/21  Brief HPI: 21 year old female without significant medical history who follows in clinic for migraines.  At her last visit she was started on propranolol for migraine prevention and Relpax for rescue. Interval History: ***   Headache days per month: *** Headache free days per month: *** Headache severity: ***  Current Headache Regimen: Preventative: *** Abortive: ***  # of doses of abortive medications per month: ***  Prior Therapies                                  Amitriptyline 30 mg QHS Propranolol 20 mg BID Maxalt 10 mg PRN Imitrex - helped but caused side effects Relpax 40 mg PRN Benadryl Aleve  Physical Exam:   Vital Signs: There were no vitals taken for this visit. GENERAL:  well appearing, in no acute distress, alert  SKIN:  Color, texture, turgor normal. No rashes or lesions HEAD:  Normocephalic/atraumatic. RESP: normal respiratory effort MSK:  No gross joint deformities.   NEUROLOGICAL: Mental Status: Alert, oriented to person, place and time, Follows commands, and Speech fluent and appropriate. Cranial Nerves: PERRL, face symmetric, no dysarthria, hearing grossly intact Motor: moves all extremities equally Gait: normal-based.  IMPRESSION: ***  PLAN: ***   Follow-up: ***  I spent a total of *** minutes on the date of the service. Headache education was done. Discussed lifestyle modification including increased oral hydration, decreased caffeine, exercise and stress management. Discussed treatment options including preventive and acute medications, natural supplements, and infusion therapy. Discussed medication overuse headache and to limit use of acute treatments to no more than 2 days/week or 10 days/month. Discussed medication side effects, adverse reactions and drug interactions. Written educational materials and patient instructions outlining all of the above were given.  Ocie Doyne,  MD

## 2022-04-07 ENCOUNTER — Telehealth: Payer: Medicaid Other | Admitting: Nurse Practitioner

## 2022-04-07 DIAGNOSIS — A084 Viral intestinal infection, unspecified: Secondary | ICD-10-CM

## 2022-04-07 MED ORDER — ONDANSETRON HCL 4 MG PO TABS: 4.0000 mg | ORAL_TABLET | Freq: Three times a day (TID) | ORAL | 0 refills | Status: AC | PRN

## 2022-04-07 NOTE — Patient Instructions (Signed)
First 24 Hours-Clear liquids  popsicles  Jello  gatorade  Sprite Second 24 hours-Add Full liquids ( Liquids you cant see through) Third 24 hours- Bland diet ( foods that are baked or broiled)  *avoiding fried foods and highly spiced foods* During these 3 days  Avoid milk, cheese, ice cream or any other dairy products  Avoid caffeine- REMEMBER Mt. Dew and Mello Yellow contain lots of caffeine You should eat and drink in  Frequent small volumes Imodium AD OTC for diarrhea If no improvement in symptoms or worsen in 2-3 days should RETRUN TO OFFICE or go to ER!

## 2022-04-07 NOTE — Progress Notes (Signed)
Virtual Visit Consent   Debra Gray, you are scheduled for a virtual visit with Mary-Margaret Daphine Deutscher, FNP, a [facility] provider, today.     Just as with appointments in the office, your consent must be obtained to participate.  Your consent will be active for this visit and any virtual visit you may have with one of our providers in the next 365 days.     If you have a MyChart account, a copy of this consent can be sent to you electronically.  All virtual visits are billed to your insurance company just like a traditional visit in the office.    As this is a virtual visit, video technology does not allow for your provider to perform a traditional examination.  This may limit your provider's ability to fully assess your condition.  If your provider identifies any concerns that need to be evaluated in person or the need to arrange testing (such as labs, EKG, etc.), we will make arrangements to do so.     Although advances in technology are sophisticated, we cannot ensure that it will always work on either your end or our end.  If the connection with a video visit is poor, the visit may have to be switched to a telephone visit.  With either a video or telephone visit, we are not always able to ensure that we have a secure connection.     I need to obtain your verbal consent now.   Are you willing to proceed with your visit today? YES   Debra Gray has provided verbal consent on 04/07/2022 for a virtual visit (video or telephone).   Mary-Margaret Daphine Deutscher, FNP   Date: 04/07/2022 7:59 AM   Virtual Visit via Video Note   I, Mary-Margaret Ronson Hagins, connected with Debra Gray ([id number], 2000-06-22) on 04/07/22 at  8:00 AM EDT by a video-enabled telemedicine application and verified that I am speaking with the correct person using two identifiers.  Location: Patient: Virtual Visit Location Patient: Home Provider: Virtual Visit Location Provider: Mobile   I  discussed the limitations of evaluation and management by telemedicine and the availability of in person appointments. The patient expressed understanding and agreed to proceed.    History of Present Illness: Debra Gray is a 20 y.o. who identifies as a female who was assigned female at birth, and is being seen today for vomiting.  HPI: Patient says she woke up this morning with vomiting. She says she feels nauseaous. Severe abdominal pain with sore throat. Rates abdominal pain 6/10 with diarrhea.     Review of Systems  Constitutional:  Negative for chills and fever.  Gastrointestinal:  Positive for abdominal pain, diarrhea, nausea and vomiting. Negative for constipation.  Neurological:  Positive for headaches.    Problems:  Patient Active Problem List   Diagnosis Date Noted   Common migraine with intractable migraine 11/23/2019   Iron deficiency anemia due to chronic blood loss 05/12/2017   Migraine without aura and without status migrainosus, not intractable 05/12/2017   Dysfunctional uterine bleeding 08/07/2015   Nexplanon insertion 08/07/2015   Adjustment disorder with depressed mood 08/07/2015    Allergies: No Known Allergies Medications:  Current Outpatient Medications:    cyclobenzaprine (FLEXERIL) 10 MG tablet, Take 1 tablet (10 mg total) by mouth 2 (two) times daily as needed for muscle spasms., Disp: 20 tablet, Rfl: 0   eletriptan (RELPAX) 40 MG tablet, PLEASE SEE ATTACHED FOR DETAILED DIRECTIONS, Disp: 10 tablet, Rfl: 2  ibuprofen (ADVIL) 600 MG tablet, Take 1 tablet (600 mg total) by mouth every 6 (six) hours as needed for moderate pain or mild pain., Disp: 30 tablet, Rfl: 0   propranolol (INDERAL) 20 MG tablet, Take 1 tablet (20 mg total) by mouth 2 (two) times daily., Disp: 60 tablet, Rfl: 3  Observations/Objective: Patient is well-developed, well-nourished in no acute distress.  Resting comfortably  at home.  Head is normocephalic, atraumatic.  No  labored breathing.  Speech is clear and coherent with logical content.  Patient is alert and oriented at baseline.    Assessment and Plan:  Debra Gray in today with chief complaint of Vomiting   1. Viral gastroenteritis First 24 Hours-Clear liquids  popsicles  Jello  gatorade  Sprite Second 24 hours-Add Full liquids ( Liquids you cant see through) Third 24 hours- Bland diet ( foods that are baked or broiled)  *avoiding fried foods and highly spiced foods* During these 3 days  Avoid milk, cheese, ice cream or any other dairy products  Avoid caffeine- REMEMBER Mt. Dew and Mello Yellow contain lots of caffeine You should eat and drink in  Frequent small volumes Immodium AD OTC for diarrhea If no improvement in symptoms or worsen in 2-3 days should RETRUN TO OFFICE or go to ER!   IF ABDOMINAL PAIN WORSENS_ you will need a face to face visit.  Meds ordered this encounter  Medications   ondansetron (ZOFRAN) 4 MG tablet    Sig: Take 1 tablet (4 mg total) by mouth every 8 (eight) hours as needed for nausea or vomiting.    Dispense:  20 tablet    Refill:  0    Order Specific Question:   Supervising Provider    Answer:   Chase Picket [id number]     Follow Up Instructions: I discussed the assessment and treatment plan with the patient. The patient was provided an opportunity to ask questions and all were answered. The patient agreed with the plan and demonstrated an understanding of the instructions.  A copy of instructions were sent to the patient via MyChart.  The patient was advised to call back or seek an in-person evaluation if the symptoms worsen or if the condition fails to improve as anticipated.  Time:  I spent 6 minutes with the patient via telehealth technology discussing the above problems/concerns.    Mary-Margaret Hassell Done, FNP

## 2022-07-28 ENCOUNTER — Ambulatory Visit (INDEPENDENT_AMBULATORY_CARE_PROVIDER_SITE_OTHER): Payer: Medicaid Other | Admitting: Primary Care

## 2022-08-05 ENCOUNTER — Ambulatory Visit (INDEPENDENT_AMBULATORY_CARE_PROVIDER_SITE_OTHER): Payer: Medicaid Other | Admitting: Primary Care

## 2022-08-09 ENCOUNTER — Ambulatory Visit: Payer: Medicaid Other | Admitting: Psychiatry

## 2022-08-12 NOTE — Progress Notes (Deleted)
   ANNUAL EXAM Patient name: Christiane Winnicki MRN [id number]  Date of birth: [birth year] Chief Complaint:   No chief complaint on file.  History of Present Illness:   Mellani Pough is a 22 y.o. G14 female being seen today for a routine annual exam.   Current complaints: ***  No LMP recorded.  Current birth control: ***  Last pap: None  Health Maintenance Due  Topic Date Due   COVID-19 Vaccine (1) Never done   Hepatitis C Screening  Never done   INFLUENZA VACCINE  01/12/2022   PAP-Cervical Cytology Screening  Never done   PAP SMEAR-Modifier  Never done    Review of Systems:   Pertinent items are noted in HPI Denies any headaches, blurred vision, fatigue, shortness of breath, chest pain, abdominal pain, abnormal vaginal discharge/itching/odor/irritation, problems with periods, bowel movements, urination, or intercourse unless otherwise stated above. *** Pertinent History Reviewed:  Reviewed past medical,surgical, social and family history.  Reviewed problem list, medications and allergies. Physical Assessment:  There were no vitals filed for this visit.There is no height or weight on file to calculate BMI.   Physical Examination:  General appearance - well appearing, and in no distress Mental status - alert, oriented to person, place, and time Psych:  She has a normal mood and affect Skin - warm and dry, normal color, no suspicious lesions noted Chest - effort normal Heart - normal rate  Breasts - breasts appear normal, no suspicious masses, no skin or nipple changes or axillary nodes Abdomen - soft, nontender, nondistended, no masses or organomegaly Pelvic -  VULVA: normal appearing vulva with no masses, tenderness or lesions  VAGINA: normal appearing vagina with normal color and discharge, no lesions  CERVIX: normal appearing cervix without discharge or lesions, no CMT UTERUS: uterus is felt to be normal size, shape, consistency and nontender  ADNEXA: No  adnexal masses or tenderness noted. Extremities:  No swelling or varicosities noted  Chaperone present for exam  No results found for this or any previous visit (from the past 24 hour(s)).  Assessment & Plan:  Diagnoses and all orders for this visit:  Encounter for annual routine gynecological examination  - Cervical cancer screening: Discussed screening Q3 years. Reviewed importance of annual exams and limits of pap smear. Pap with reflex HPV *** - GC/CT: Discussed and recommended. Pt  {Blank single:19197::"accepts","declines"} - Gardasil: completed - Birth Control: {Birth control type:23956} - Breast Health: Encouraged self breast awareness/exams. Teaching provided. - Follow-up: 12 months and prn     No orders of the defined types were placed in this encounter.   Meds: No orders of the defined types were placed in this encounter.   Follow-up: No follow-ups on file.  Radene Gunning, MD 08/12/2022 12:43 PM

## 2022-08-18 ENCOUNTER — Encounter: Payer: Medicaid Other | Admitting: Obstetrics and Gynecology

## 2022-08-18 DIAGNOSIS — Z01419 Encounter for gynecological examination (general) (routine) without abnormal findings: Secondary | ICD-10-CM

## 2022-10-05 DIAGNOSIS — H5213 Myopia, bilateral: Secondary | ICD-10-CM | POA: Diagnosis not present

## 2023-02-10 DIAGNOSIS — K029 Dental caries, unspecified: Secondary | ICD-10-CM | POA: Diagnosis not present

## 2023-03-01 DIAGNOSIS — Z7251 High risk heterosexual behavior: Secondary | ICD-10-CM | POA: Diagnosis not present

## 2023-03-01 DIAGNOSIS — Z0000 Encounter for general adult medical examination without abnormal findings: Secondary | ICD-10-CM | POA: Diagnosis not present

## 2023-03-01 DIAGNOSIS — D539 Nutritional anemia, unspecified: Secondary | ICD-10-CM | POA: Diagnosis not present

## 2023-03-01 DIAGNOSIS — E612 Magnesium deficiency: Secondary | ICD-10-CM | POA: Diagnosis not present

## 2023-03-01 DIAGNOSIS — R5383 Other fatigue: Secondary | ICD-10-CM | POA: Diagnosis not present

## 2023-03-01 DIAGNOSIS — Z1159 Encounter for screening for other viral diseases: Secondary | ICD-10-CM | POA: Diagnosis not present

## 2023-03-01 DIAGNOSIS — E559 Vitamin D deficiency, unspecified: Secondary | ICD-10-CM | POA: Diagnosis not present

## 2023-03-01 DIAGNOSIS — Z3200 Encounter for pregnancy test, result unknown: Secondary | ICD-10-CM | POA: Diagnosis not present

## 2023-03-15 DIAGNOSIS — E559 Vitamin D deficiency, unspecified: Secondary | ICD-10-CM | POA: Diagnosis not present

## 2023-03-15 DIAGNOSIS — Z6822 Body mass index (BMI) 22.0-22.9, adult: Secondary | ICD-10-CM | POA: Diagnosis not present

## 2023-03-15 DIAGNOSIS — F419 Anxiety disorder, unspecified: Secondary | ICD-10-CM | POA: Diagnosis not present

## 2023-03-15 DIAGNOSIS — G43909 Migraine, unspecified, not intractable, without status migrainosus: Secondary | ICD-10-CM | POA: Diagnosis not present

## 2023-03-15 DIAGNOSIS — Z3200 Encounter for pregnancy test, result unknown: Secondary | ICD-10-CM | POA: Diagnosis not present

## 2023-04-12 DIAGNOSIS — F419 Anxiety disorder, unspecified: Secondary | ICD-10-CM | POA: Diagnosis not present

## 2023-04-12 DIAGNOSIS — Z3200 Encounter for pregnancy test, result unknown: Secondary | ICD-10-CM | POA: Diagnosis not present

## 2023-05-02 DIAGNOSIS — F332 Major depressive disorder, recurrent severe without psychotic features: Secondary | ICD-10-CM | POA: Diagnosis not present

## 2023-05-02 DIAGNOSIS — Z6823 Body mass index (BMI) 23.0-23.9, adult: Secondary | ICD-10-CM | POA: Diagnosis not present

## 2023-05-02 DIAGNOSIS — F4001 Agoraphobia with panic disorder: Secondary | ICD-10-CM | POA: Diagnosis not present

## 2023-05-02 DIAGNOSIS — F411 Generalized anxiety disorder: Secondary | ICD-10-CM | POA: Diagnosis not present

## 2023-05-06 ENCOUNTER — Ambulatory Visit
Admission: EM | Admit: 2023-05-06 | Discharge: 2023-05-06 | Disposition: A | Discharge status: Home / Self Care | Payer: Medicaid Other

## 2023-05-06 DIAGNOSIS — G43809 Other migraine, not intractable, without status migrainosus: Secondary | ICD-10-CM | POA: Diagnosis not present

## 2023-05-06 MED ORDER — ONDANSETRON HCL 4 MG/2ML IJ SOLN
4.0000 mg | Freq: Once | INTRAMUSCULAR | Status: AC
  Administered 2023-05-06: 4 mg via INTRAMUSCULAR

## 2023-05-06 MED ORDER — KETOROLAC TROMETHAMINE 30 MG/ML IJ SOLN
30.0000 mg | Freq: Once | INTRAMUSCULAR | Status: AC
  Administered 2023-05-06: 30 mg via INTRAMUSCULAR

## 2023-05-06 NOTE — ED Provider Notes (Signed)
[facility]    CSN: [id number] Arrival date & time: 05/06/23  1702      History   Chief Complaint Chief Complaint  Patient presents with   Migraine    HPI Debra Gray is a 22 y.o. female.   Patient here today for evaluation of headache that started earlier today.  She has had some nausea and vomiting.  She does have some photosensitivity as well.  She does have history of migraines and states current symptoms are similar.  She does not currently see a neurologist.  She did try Excedrin Migraine without resolution of headache.  The history is provided by the patient.  Migraine Associated symptoms include headaches. Pertinent negatives include no abdominal pain and no shortness of breath.    Past Medical History:  Diagnosis Date   Adjustment disorder with depressed mood 08/07/2015   Common migraine with intractable migraine 11/23/2019   Constipation    Environmental allergies    Iron deficiency anemia     Patient Active Problem List   Diagnosis Date Noted   Common migraine with intractable migraine 11/23/2019   Iron deficiency anemia due to chronic blood loss 05/12/2017   Migraine without aura and without status migrainosus, not intractable 05/12/2017    History reviewed. No pertinent surgical history.  OB History   No obstetric history on file.      Home Medications    Prior to Admission medications   Medication Sig Start Date End Date Taking? Authorizing Provider  busPIRone (BUSPAR) 5 MG tablet Take 5 mg by mouth 3 (three) times daily.   Yes [provider]  cyclobenzaprine (FLEXERIL) 10 MG tablet Take 1 tablet (10 mg total) by mouth 2 (two) times daily as needed for muscle spasms. 12/11/21   Rising, Lurena Joiner, PA-C  eletriptan (RELPAX) 40 MG tablet PLEASE SEE ATTACHED FOR DETAILED DIRECTIONS 09/03/21   Ocie Doyne, MD  ibuprofen (ADVIL) 600 MG tablet Take 1 tablet (600 mg total) by mouth every 6 (six) hours as needed for  moderate pain or mild pain. 12/11/21   Rising, Jalyne Brodzinski, PA-C  ondansetron (ZOFRAN) 4 MG tablet Take 1 tablet (4 mg total) by mouth every 8 (eight) hours as needed for nausea or vomiting. 04/07/22   Daphine Deutscher, Mary-Margaret, FNP  propranolol (INDERAL) 20 MG tablet Take 1 tablet (20 mg total) by mouth 2 (two) times daily. 08/26/21   Ocie Doyne, MD    Family History Family History  Problem Relation Age of Onset   Breast cancer Mother        Onset 26s   Hypertension Father    Hyperlipidemia Father    Ovarian cancer Unknown        Maternal great grandmother   Breast cancer Maternal Grandmother 31   Colon cancer Unknown        Maternal great uncle    Social History Social History   Tobacco Use   Smoking status: Never    Passive exposure: Yes   Smokeless tobacco: Never   Tobacco comments:    mom smokes inside the home  Substance Use Topics   Alcohol use: No    Alcohol/week: 0.0 standard drinks of alcohol   Drug use: No     Allergies   Patient has no known allergies.   Review of Systems Review of Systems  Constitutional:  Negative for chills and fever.  HENT:  Negative for congestion.   Eyes:  Positive for photophobia. Negative for discharge and redness.  Respiratory:  Negative for  shortness of breath.   Gastrointestinal:  Positive for nausea and vomiting. Negative for abdominal pain.  Neurological:  Positive for headaches. Negative for dizziness and light-headedness.     Physical Exam Triage Vital Signs ED Triage Vitals  Encounter Vitals Group     BP 05/06/23 1749 138/82     Systolic BP Percentile --      Diastolic BP Percentile --      Pulse Rate 05/06/23 1749 84     Resp --      Temp 05/06/23 1749 98 F (36.7 C)     Temp Source 05/06/23 1749 Oral     SpO2 05/06/23 1749 98 %     Weight --      Height --      Head Circumference --      Peak Flow --      Pain Score 05/06/23 1750 9     Pain Loc --      Pain Education --      Exclude from Growth Chart --     No data found.  Updated Vital Signs BP 138/82 (BP Location: Right Arm)   Pulse 84   Temp 98 F (36.7 C) (Oral)   LMP 04/15/2023 (Approximate)   SpO2 98%   Visual Acuity Right Eye Distance:   Left Eye Distance:   Bilateral Distance:    Right Eye Near:   Left Eye Near:    Bilateral Near:     Physical Exam Vitals and nursing note reviewed.  Constitutional:      General: She is not in acute distress.    Appearance: Normal appearance. She is not ill-appearing.  HENT:     Head: Normocephalic and atraumatic.  Eyes:     Extraocular Movements: Extraocular movements intact.     Conjunctiva/sclera: Conjunctivae normal.     Pupils: Pupils are equal, round, and reactive to light.  Cardiovascular:     Rate and Rhythm: Normal rate.  Pulmonary:     Effort: Pulmonary effort is normal. No respiratory distress.  Neurological:     General: No focal deficit present.     Mental Status: She is alert.     Comments: No facial droop, normal speech, normal finger-nose  Psychiatric:        Mood and Affect: Mood normal.        Behavior: Behavior normal.        Thought Content: Thought content normal.      UC Treatments / Results  Labs (all labs ordered are listed, but only abnormal results are displayed) Labs Reviewed - No data to display  EKG   Radiology No results found.  Procedures Procedures (including critical care time)  Medications Ordered in UC Medications  ondansetron (ZOFRAN) injection 4 mg (4 mg Intramuscular Given 05/06/23 1828)  ketorolac (TORADOL) 30 MG/ML injection 30 mg (30 mg Intramuscular Given 05/06/23 1828)    Initial Impression / Assessment and Plan / UC Course  I have reviewed the triage vital signs and the nursing notes.  Pertinent labs & imaging results that were available during my care of the patient were reviewed by me and considered in my medical decision making (see chart for details).    Zofran and Toradol injection administered in office  for treatment of suspected migraine.  Recommended further evaluation emergency department if no improvement or with any worsening headache.  Patient expresses understanding.  Final Clinical Impressions(s) / UC Diagnoses   Final diagnoses:  Other migraine without status migrainosus, not intractable  Discharge Instructions   None    ED Prescriptions   None    PDMP not reviewed this encounter.   Tomi Bamberger, PA-C 05/14/23 1321

## 2023-05-06 NOTE — ED Triage Notes (Signed)
Hx of migraines. Reports onset today approx 10am. N/V prior to arrival. Light sensitivity currently. States has seen a neurologist in past but not currently. Took Excedrin Migraine at 1300 today.

## 2023-05-14 ENCOUNTER — Encounter: Payer: Self-pay | Admitting: Physician Assistant

## 2023-05-16 DIAGNOSIS — F32A Depression, unspecified: Secondary | ICD-10-CM | POA: Diagnosis not present

## 2023-05-18 DIAGNOSIS — F332 Major depressive disorder, recurrent severe without psychotic features: Secondary | ICD-10-CM | POA: Diagnosis not present

## 2023-05-18 DIAGNOSIS — Z6826 Body mass index (BMI) 26.0-26.9, adult: Secondary | ICD-10-CM | POA: Diagnosis not present

## 2023-05-18 DIAGNOSIS — R030 Elevated blood-pressure reading, without diagnosis of hypertension: Secondary | ICD-10-CM | POA: Diagnosis not present

## 2023-05-18 DIAGNOSIS — G4700 Insomnia, unspecified: Secondary | ICD-10-CM | POA: Diagnosis not present

## 2023-05-18 DIAGNOSIS — F411 Generalized anxiety disorder: Secondary | ICD-10-CM | POA: Diagnosis not present

## 2023-05-23 DIAGNOSIS — F32A Depression, unspecified: Secondary | ICD-10-CM | POA: Diagnosis not present

## 2023-05-30 DIAGNOSIS — F32A Depression, unspecified: Secondary | ICD-10-CM | POA: Diagnosis not present

## 2023-06-01 DIAGNOSIS — Z6826 Body mass index (BMI) 26.0-26.9, adult: Secondary | ICD-10-CM | POA: Diagnosis not present

## 2023-06-01 DIAGNOSIS — F411 Generalized anxiety disorder: Secondary | ICD-10-CM | POA: Diagnosis not present

## 2023-06-01 DIAGNOSIS — G4700 Insomnia, unspecified: Secondary | ICD-10-CM | POA: Diagnosis not present

## 2023-06-01 DIAGNOSIS — F332 Major depressive disorder, recurrent severe without psychotic features: Secondary | ICD-10-CM | POA: Diagnosis not present

## 2023-07-09 DIAGNOSIS — G43909 Migraine, unspecified, not intractable, without status migrainosus: Secondary | ICD-10-CM | POA: Diagnosis not present

## 2023-07-09 DIAGNOSIS — F411 Generalized anxiety disorder: Secondary | ICD-10-CM | POA: Diagnosis not present

## 2023-07-09 DIAGNOSIS — Z6826 Body mass index (BMI) 26.0-26.9, adult: Secondary | ICD-10-CM | POA: Diagnosis not present

## 2023-07-09 DIAGNOSIS — G4700 Insomnia, unspecified: Secondary | ICD-10-CM | POA: Diagnosis not present

## 2023-07-09 DIAGNOSIS — Z1339 Encounter for screening examination for other mental health and behavioral disorders: Secondary | ICD-10-CM | POA: Diagnosis not present

## 2023-07-09 DIAGNOSIS — F332 Major depressive disorder, recurrent severe without psychotic features: Secondary | ICD-10-CM | POA: Diagnosis not present

## 2023-07-09 DIAGNOSIS — Z1331 Encounter for screening for depression: Secondary | ICD-10-CM | POA: Diagnosis not present

## 2023-08-09 DIAGNOSIS — L700 Acne vulgaris: Secondary | ICD-10-CM | POA: Diagnosis not present

## 2023-09-13 DIAGNOSIS — L700 Acne vulgaris: Secondary | ICD-10-CM | POA: Diagnosis not present

## 2023-09-17 DIAGNOSIS — G4700 Insomnia, unspecified: Secondary | ICD-10-CM | POA: Diagnosis not present

## 2023-09-17 DIAGNOSIS — F411 Generalized anxiety disorder: Secondary | ICD-10-CM | POA: Diagnosis not present

## 2023-09-17 DIAGNOSIS — G43909 Migraine, unspecified, not intractable, without status migrainosus: Secondary | ICD-10-CM | POA: Diagnosis not present

## 2023-09-17 DIAGNOSIS — F332 Major depressive disorder, recurrent severe without psychotic features: Secondary | ICD-10-CM | POA: Diagnosis not present

## 2023-10-29 DIAGNOSIS — R051 Acute cough: Secondary | ICD-10-CM | POA: Diagnosis not present

## 2023-10-29 NOTE — Progress Notes (Signed)
 Debra Gray is a 23 y.o.  with  reports that she has never smoked. She has never been exposed to tobacco smoke. She has never used smokeless tobacco. with  Active Ambulatory Problems    Diagnosis Date Noted  . No Active Ambulatory Problems   Resolved Ambulatory Problems    Diagnosis Date Noted  . No Resolved Ambulatory Problems   Past Medical History:  Diagnosis Date  . Anxiety    who presents today at Urgent Care for  Chief Complaint  Patient presents with  . Cough    Patient states that she has been having cough, headache, nasal congestion since yesterday. She has taken alka seltzer plus for symptoms.        History of Present Illness The patient is a 23 year old female who presents with cough, headache, and nasal congestion since yesterday.  She reports the onset of symptoms yesterday morning, characterized by yellow nasal discharge and occasional expectoration of blood-tinged sputum. She has no history of asthma. She has taken some Alka-Seltzer Plus for symptoms. She had a telehealth consultation, which resulted in a recommendation for an urgent care visit. She has previously been prescribed steroid tablets for migraine management.  MEDICATIONS Past: Alka-Seltzer Plus      Patient has no co-morbidities  or medications that might increase the risk for developing a severe infection     reports that she has never smoked. She has never been exposed to tobacco smoke. She has never used smokeless tobacco.  Review of Systems  Review of systems is otherwise negative except as noted in the HPI and Assessment/MDM   Physical Exam  BP 121/84 (BP Location: Right arm, Patient Position: Sitting)   Pulse 104   Temp 99.5 F (37.5 C) (Oral)   Resp 20   Wt 60.3 kg (133 lb)   LMP 10/07/2023 (Exact Date)   SpO2 100%   Constitutional:      General: Patient is not in acute distress.    Appearance: Normal appearance.  Neurological:     General: No focal deficit present.      Mental Status: alert and oriented to person, place, and time.  HENT:     Eyes:     Conjunctiva/sclera: Conjunctivae normal. Pharynx normal    There is no associated anterior cervical lymphadenopathy    Pupils: Pupils are equal, round, and reactive to light.     TM's normal with no visible effusion  Cardiovascular:     Heart sounds: Normal heart sounds. No murmur heard.    No Lower extremity edema noted Pulmonary:     Effort: Pulmonary effort is normal. No respiratory distress.     Breath sounds: No wheezing, or rales. Some mild rhonchi  Abdominal:     General: Bowel sounds are normal. There is no distension.     Tenderness: There is no abdominal tenderness.  Musculoskeletal:        General: Normal range of motion.     Neck:  No rigidity.  Skin:    General: Skin is warm and dry.  Psychiatric:        Mood and Affect: Mood normal.   XR Chest 2 Views  Preliminary Result by Debra Lynwood Hummer, MD [phone]  XR CHEST 2 VIEWS, 10/29/2023 8:50 AM    INDICATION: Acute cough \ R05.1 Acute cough   COMPARISON: None    FINDINGS:     .  Supportive devices: None  .  Cardiovascular/Mediastinum: The cardiomediastinal silhouette is within   normal  limits.  .  Lungs/pleura: No focal consolidative airspace disease. No pleural   effusion or pneumothorax.  Debra Gray  Upper abdomen: Visualized portions are unremarkable.   .  Osseous structures/Soft tissues: No acute osseous abnormality.    IMPRESSION:  CONCLUSION:    No radiographic evidence of acute cardiac or pulmonary abnormality.                  DIAGNOSIS/PLAN     1. Bronchitis  azithromycin (ZITHROMAX) 250 mg tablet   predniSONE (DELTASONE) 20 mg tablet   albuterol  HFA (PROVENTIL  HFA;VENTOLIN  HFA;PROAIR  HFA) 90 mcg/actuation inhaler    2. Acute cough  POC SARS-COV-2 SYMPTOMATIC (IDNOW)   POC Influenza A&B NAT (IDNOW)   XR Chest 2 Views    3. Nonintractable headache, unspecified chronicity pattern, unspecified  headache type  POC SARS-COV-2 SYMPTOMATIC (IDNOW)   POC Influenza A&B NAT (IDNOW)      Assessment & Plan 1. Bronchitis. Influenza and COVID-19 tests returned negative results. A chest x-ray was performed to rule out pneumonia, which did not reveal any significant abnormalities. She will be treated with azithromycin, taking 2 pills today and 1 pill daily for the subsequent 4 days. Additionally, a steroid regimen will be initiated, consisting of 2 pills taken concurrently each day for a duration of 5 days. An albuterol  inhaler will be provided for use as needed, with a dosage of 2 puffs every 6 hours, particularly in response to wheezing or shortness of breath. She is expected to experience symptom relief within 24 hours of starting the antibiotic treatment, allowing her to return to work on Monday. However, an additional day off has been granted to ensure complete recovery before resuming work.    We discussed risks and side effects of medications, and also discussed red flags which would warrant immediate follow-up.   Urgent Care Disposition:  Home Care

## 2024-01-06 DIAGNOSIS — F411 Generalized anxiety disorder: Secondary | ICD-10-CM | POA: Diagnosis not present

## 2024-01-06 DIAGNOSIS — Z79899 Other long term (current) drug therapy: Secondary | ICD-10-CM | POA: Diagnosis not present

## 2024-01-06 DIAGNOSIS — G43909 Migraine, unspecified, not intractable, without status migrainosus: Secondary | ICD-10-CM | POA: Diagnosis not present

## 2024-01-06 DIAGNOSIS — F332 Major depressive disorder, recurrent severe without psychotic features: Secondary | ICD-10-CM | POA: Diagnosis not present

## 2024-01-06 DIAGNOSIS — G4700 Insomnia, unspecified: Secondary | ICD-10-CM | POA: Diagnosis not present

## 2024-01-09 ENCOUNTER — Inpatient Hospital Stay (HOSPITAL_COMMUNITY)

## 2024-01-09 ENCOUNTER — Inpatient Hospital Stay (HOSPITAL_COMMUNITY)
Admission: AD | Admit: 2024-01-09 | Discharge: 2024-01-09 | Disposition: A | Discharge status: Home / Self Care | Attending: Obstetrics and Gynecology | Admitting: Obstetrics and Gynecology

## 2024-01-09 ENCOUNTER — Other Ambulatory Visit: Payer: Self-pay | Admitting: Family Medicine

## 2024-01-09 ENCOUNTER — Telehealth: Payer: Self-pay

## 2024-01-09 ENCOUNTER — Encounter (HOSPITAL_COMMUNITY): Payer: Self-pay | Admitting: Obstetrics and Gynecology

## 2024-01-09 DIAGNOSIS — O99011 Anemia complicating pregnancy, first trimester: Secondary | ICD-10-CM | POA: Diagnosis not present

## 2024-01-09 DIAGNOSIS — O208 Other hemorrhage in early pregnancy: Secondary | ICD-10-CM | POA: Insufficient documentation

## 2024-01-09 DIAGNOSIS — Z3A01 Less than 8 weeks gestation of pregnancy: Secondary | ICD-10-CM | POA: Diagnosis not present

## 2024-01-09 DIAGNOSIS — O209 Hemorrhage in early pregnancy, unspecified: Secondary | ICD-10-CM

## 2024-01-09 LAB — COMPREHENSIVE METABOLIC PANEL WITH GFR
ALT: 10 U/L (ref 0–44)
AST: 15 U/L (ref 15–41)
Albumin: 3.7 g/dL (ref 3.5–5.0)
Alkaline Phosphatase: 43 U/L (ref 38–126)
Anion gap: 10 (ref 5–15)
BUN: 5 mg/dL — ABNORMAL LOW (ref 6–20)
CO2: 22 mmol/L (ref 22–32)
Calcium: 8.9 mg/dL (ref 8.9–10.3)
Chloride: 104 mmol/L (ref 98–111)
Creatinine, Ser: 0.63 mg/dL (ref 0.44–1.00)
GFR, Estimated: >60 mL/min (ref >60)
Glucose, Bld: 86 mg/dL (ref 70–99)
Potassium: 3.6 mmol/L (ref 3.5–5.1)
Sodium: 136 mmol/L (ref 135–145)
Total Bilirubin: 0.5 mg/dL (ref 0.0–1.2)
Total Protein: 7.2 g/dL (ref 6.5–8.1)

## 2024-01-09 LAB — WET PREP, GENITAL
Clue Cells Wet Prep HPF POC: NONE SEEN
Sperm: NONE SEEN
Trich, Wet Prep: NONE SEEN
WBC, Wet Prep HPF POC: <10 (ref <10)
Yeast Wet Prep HPF POC: NONE SEEN

## 2024-01-09 LAB — CBC
HCT: 26.8 % — ABNORMAL LOW (ref 36.0–46.0)
Hemoglobin: 8.8 g/dL — ABNORMAL LOW (ref 12.0–15.0)
MCH: 24.9 pg — ABNORMAL LOW (ref 26.0–34.0)
MCHC: 32.8 g/dL (ref 30.0–36.0)
MCV: 75.9 fL — ABNORMAL LOW (ref 80.0–100.0)
Platelets: 386 K/uL (ref 150–400)
RBC: 3.53 MIL/uL — ABNORMAL LOW (ref 3.87–5.11)
RDW: 16.2 % — ABNORMAL HIGH (ref 11.5–15.5)
WBC: 8.1 K/uL (ref 4.0–10.5)
nRBC: 0 % (ref 0.0–0.2)

## 2024-01-09 LAB — HCG, QUANTITATIVE, PREGNANCY: hCG, Beta Chain, Quant, S: 65805 m[IU]/mL — ABNORMAL HIGH (ref <5)

## 2024-01-09 LAB — ABO/RH: ABO/RH(D): O POS

## 2024-01-09 MED ORDER — ACCRUFER 30 MG PO CAPS: 1.0000 | ORAL_CAPSULE | Freq: Every day | ORAL | 2 refills | Status: AC

## 2024-01-09 NOTE — Patient Instructions (Signed)
 [facility] at [facility] (maternity assessment unit) [address] [place],  [place]  [address]

## 2024-01-09 NOTE — MAU Note (Signed)
 Debra Gray is a 23 y.o. at Unknown here in MAU reporting: started bleeding on Friday, as spotting.  Got heavier and was red, kind of slimy, then went back to brown, has been brown since. Only sees when she wipes.  Not wearing a pad. No pain, just cramping, not painful  LMP: 6/11,just confirmed preg at [facility], they sent her here Onset of complaint: Friday Pain score: none Vitals:   01/09/24 1036  BP: 114/60  Pulse: 98  Resp: 17  Temp: 98.8 F (37.1 C)  SpO2: 100%      Lab orders placed from triage:  UPT; vag swabs

## 2024-01-09 NOTE — Progress Notes (Signed)
 IV iron orders Lab Results  Component Value Date   HGB 8.8 (L) 01/09/2024

## 2024-01-09 NOTE — MAU Provider Note (Signed)
 Chief Complaint: Vaginal Bleeding and Possible Pregnancy   None     SUBJECTIVE HPI: Debra Gray is a 23 y.o. G1P0 at [redacted]w[redacted]d by LMP who presents to maternity admissions reporting vaginal bleeding.  Starting on Friday AM she had bright red blood vaginal spotting while she was going to the bathroom.  She noticed mild bleeding less than a period every time she went to the bathroom, did not need to wear panty liners.  Later that evening she noticed a thicker mucousy clump of blood, was not sure if it was a clot.  Has had dark brown bloody discharge since Saturday, gradually decreasing in quantity.  Saturday evening had 1 episode of nausea that resolved with 1 episode of emesis.  No unusual foods or sick contacts, no abdominal pain, or diarrhea. [facility].  She denies vaginal itching/burning, urinary symptoms, dizziness, n/v, or fever/chills.  History of migraines and is scheduled to see neurologist.  HPI  Past Medical History:  Diagnosis Date   Adjustment disorder with depressed mood 08/07/2015   Common migraine with intractable migraine 11/23/2019   Constipation    Environmental allergies    Iron deficiency anemia    Past Surgical History:  Procedure Laterality Date   NO PAST SURGERIES     Social History   Socioeconomic History   Marital status: Single    Spouse name: Not on file   Number of children: Not on file   Years of education: Not on file   Highest education level: Not on file  Occupational History   Not on file  Tobacco Use   Smoking status: Never    Passive exposure: Yes   Smokeless tobacco: Never   Tobacco comments:    mom smokes inside the home  Vaping Use   Vaping status: Former  Substance and Sexual Activity   Alcohol use: No    Alcohol/week: 0.0 standard drinks of alcohol   Drug use: No   Sexual activity: Not Currently    Birth control/protection: None  Other Topics Concern   Not on file  Social History  Narrative   Not on file   Social Drivers of Health   Financial Resource Strain: Not on file  Food Insecurity: Low Risk  (10/29/2023)   Received from [facility]   Hunger Vital Sign    Within the past 12 months, you worried that your food would run out before you got money to buy more: Never true    Within the past 12 months, the food you bought just didn't last and you didn't have money to get more. : Never true  Transportation Needs: No Transportation Needs (10/29/2023)   Received from [facility]    In the past 12 months, has lack of reliable transportation kept you from medical appointments, meetings, work or from getting things needed for daily living? : No  Physical Activity: Not on file  Stress: Not on file  Social Connections: Not on file  Intimate Partner Violence: Not on file   No current facility-administered medications on file prior to encounter.   Current Outpatient Medications on File Prior to Encounter  Medication Sig Dispense Refill   cyclobenzaprine  (FLEXERIL ) 10 MG tablet Take 1 tablet (10 mg total) by mouth 2 (two) times daily as needed for muscle spasms. 20 tablet 0   traZODone (DESYREL) 150 MG tablet Take 150 mg by mouth at bedtime.     busPIRone (BUSPAR) 5 MG tablet Take  5 mg by mouth 3 (three) times daily.     eletriptan  (RELPAX ) 40 MG tablet PLEASE SEE ATTACHED FOR DETAILED DIRECTIONS 10 tablet 2   ibuprofen  (ADVIL ) 600 MG tablet Take 1 tablet (600 mg total) by mouth every 6 (six) hours as needed for moderate pain or mild pain. 30 tablet 0   ondansetron  (ZOFRAN ) 4 MG tablet Take 1 tablet (4 mg total) by mouth every 8 (eight) hours as needed for nausea or vomiting. 20 tablet 0   propranolol  (INDERAL ) 20 MG tablet Take 1 tablet (20 mg total) by mouth 2 (two) times daily. 60 tablet 3   No Known Allergies  ROS:  Pertinent positives/negatives listed above.  I have reviewed patient's Past Medical Hx, Surgical Hx, Family Hx, Social Hx,  medications and allergies.   Physical Exam  Patient Vitals for the past 24 hrs:  BP Temp Temp src Pulse Resp SpO2 Height Weight  01/09/24 1349 108/61 -- -- 75 16 -- -- --  01/09/24 1036 114/60 98.8 F (37.1 C) Oral 98 17 100 % 5' (1.524 m) 59.9 kg   Constitutional: Well-developed, well-nourished female in no acute distress.  Cardiovascular: normal rate Respiratory: normal effort GI: Abd soft, non-tender. Pos BS x 4. No rebound or guarding. MS: Extremities nontender, no edema, normal ROM Neurologic: Alert and oriented x 4.  GU: Neg CVAT.  LAB RESULTS Results for orders placed or performed during the hospital encounter of 01/09/24 (from the past 24 hours)  Wet prep, genital     Status: None   Collection Time: 01/09/24 10:40 AM   Specimen: PATH Cytology Cervicovaginal Ancillary Only  Result Value Ref Range   Yeast Wet Prep HPF POC NONE SEEN NONE SEEN   Trich, Wet Prep NONE SEEN NONE SEEN   Clue Cells Wet Prep HPF POC NONE SEEN NONE SEEN   WBC, Wet Prep HPF POC <10 <10   Sperm NONE SEEN   hCG, quantitative, pregnancy     Status: Abnormal   Collection Time: 01/09/24 11:53 AM  Result Value Ref Range   hCG, Beta Chain, Quant, S 65,805 (H) <5 mIU/mL  CBC     Status: Abnormal   Collection Time: 01/09/24 11:53 AM  Result Value Ref Range   WBC 8.1 4.0 - 10.5 K/uL   RBC 3.53 (L) 3.87 - 5.11 MIL/uL   Hemoglobin 8.8 (L) 12.0 - 15.0 g/dL   HCT 73.1 (L) 63.9 - 53.9 %   MCV 75.9 (L) 80.0 - 100.0 fL   MCH 24.9 (L) 26.0 - 34.0 pg   MCHC 32.8 30.0 - 36.0 g/dL   RDW 83.7 (H) 88.4 - 84.4 %   Platelets 386 150 - 400 K/uL   nRBC 0.0 0.0 - 0.2 %  Comprehensive metabolic panel     Status: Abnormal   Collection Time: 01/09/24 11:53 AM  Result Value Ref Range   Sodium 136 135 - 145 mmol/L   Potassium 3.6 3.5 - 5.1 mmol/L   Chloride 104 98 - 111 mmol/L   CO2 22 22 - 32 mmol/L   Glucose, Bld 86 70 - 99 mg/dL   BUN 5 (L) 6 - 20 mg/dL   Creatinine, Ser 9.36 0.44 - 1.00 mg/dL   Calcium 8.9 8.9  - 89.6 mg/dL   Total Protein 7.2 6.5 - 8.1 g/dL   Albumin 3.7 3.5 - 5.0 g/dL   AST 15 15 - 41 U/L   ALT 10 0 - 44 U/L   Alkaline Phosphatase 43 38 - 126  U/L   Total Bilirubin 0.5 0.0 - 1.2 mg/dL   GFR, Estimated >39 >39 mL/min   Anion gap 10 5 - 15  ABO/Rh     Status: None   Collection Time: 01/09/24 11:53 AM  Result Value Ref Range   ABO/RH(D) O POS    No rh immune globuloin      NOT A RH IMMUNE GLOBULIN CANDIDATE, PT RH POSITIVE Performed at [facility], [address], [place], [place] [address]     --/--/O POS (07/28 1153)  IMAGING US  OB LESS THAN 14 WEEKS WITH OB TRANSVAGINAL Result Date: 01/09/2024 CLINICAL DATA:  Spotting for 4 days, positive urine pregnancy test EXAM: OBSTETRIC <14 WK US  AND TRANSVAGINAL OB US  TECHNIQUE: Both transabdominal and transvaginal ultrasound examinations were performed for complete evaluation of the gestation as well as the maternal uterus, adnexal regions, and pelvic cul-de-sac. Transvaginal technique was performed to assess early pregnancy. COMPARISON:  None Available. FINDINGS: Intrauterine gestational sac: Single Yolk sac:  Visualized. Embryo:  Visualized. Cardiac Activity: Visualized. Heart Rate: 138 bpm CRL:  11.6 mm   7 w   2 d                  US  EDC: 08/25/2024 Subchorionic hemorrhage: There is a small subchronic hemorrhage along the inferior margin of the gestational sac, measuring 1.4 x 0.8 cm. Maternal uterus/adnexae: Left ovary measures 2.5 x 1.5 x 1.5 cm and the right ovary measures 4.7 x 2.8 x 3.6 cm. There is a 2.3 cm corpus luteal cyst within the right ovary. Trace free fluid within the right adnexa. Cervix appears closed. IMPRESSION: 1. Single live intrauterine pregnancy as above, estimated age 75 weeks and 2 days. 2. Small subchorionic hemorrhage. 3. Trace free fluid within the right adnexa, likely physiologic. Electronically Signed   By: Ozell Daring M.D.   On: 01/09/2024 13:40    MDM: Vaginal Bleeding: Small subchorionic  hematoma confirmed on US , DDX included subchorionic vs implantation bleeding given the quantity and absence of abdominal pain or other systemic symptoms.  Must rule out ectopic due to nausea vomiting on Saturday and mild abdominal cramping with unconfirmed location of pregnancy.  Unlikely to be secondary to BV your candidiasis given negative wet prep.  GC swab pending.  Quantitative hCG was ~65,000 consistent with GA.  ABO ordered since this is a new patient, O+.  Anemia: History of anemia in the past due to menorrhagia, CBC found Hgb 8.8, unclear baseline.  MAU Management: Orders Placed This Encounter  Procedures   Wet prep, genital   US  OB LESS THAN 14 WEEKS WITH OB TRANSVAGINAL   hCG, quantitative, pregnancy   CBC   Comprehensive metabolic panel   ABO/Rh   Discharge patient   Discharge patient Discharge disposition: 01-Home or Self Care; Discharge patient date: 01/09/2024    Meds ordered this encounter  Medications   Ferric Maltol  (ACCRUFER ) 30 MG CAPS    Sig: Take 1 capsule (30 mg total) by mouth daily.    Dispense:  30 capsule    Refill:  2     ASSESSMENT 1. Vaginal bleeding affecting early pregnancy   2. [redacted] weeks gestation of pregnancy   3. Anemia affecting pregnancy in first trimester     PLAN Discharge home with strict return precautions. -IV Iron infusion order sent -PO Iron supplement daily during pregnancy  Allergies as of 01/09/2024   No Known Allergies      Medication List     STOP  taking these medications    ibuprofen  600 MG tablet Commonly known as: ADVIL    traZODone 150 MG tablet Commonly known as: DESYREL       TAKE these medications    ACCRUFeR  30 MG Caps Generic drug: Ferric Maltol  Take 1 capsule (30 mg total) by mouth daily.   busPIRone 5 MG tablet Commonly known as: BUSPAR Take 5 mg by mouth 3 (three) times daily.   cyclobenzaprine  10 MG tablet Commonly known as: FLEXERIL  Take 1 tablet (10 mg total) by mouth 2 (two) times daily as  needed for muscle spasms.   eletriptan  40 MG tablet Commonly known as: RELPAX  PLEASE SEE ATTACHED FOR DETAILED DIRECTIONS   ondansetron  4 MG tablet Commonly known as: Zofran  Take 1 tablet (4 mg total) by mouth every 8 (eight) hours as needed for nausea or vomiting.   propranolol  20 MG tablet Commonly known as: INDERAL  Take 1 tablet (20 mg total) by mouth 2 (two) times daily.        Follow-up Information     [facility] Follow up.   Specialty: Obstetrics and Gynecology Why: As needed for emergencies Contact information: [address] [place]  [address] [phone]                Fairy Amy, MD [facility] Family Medicine Resident, PGY-1 01/09/2024  2:11 PM

## 2024-01-09 NOTE — Progress Notes (Signed)
 HPI:   Chief Complaint  Patient presents with  . Vaginal Bleeding - Pregnant    Patient started to have vaginal bleeding on Friday, and states that she is [redacted] weeks pregnant. She has done multiple home pregnancy tests. She has an OB appointment coming up, she reached out to them and they advised her to come here.She states the bleeding started with just spotting, then it progressively gotten worse and looked like she passed a clot over the weekend.     Vaginal Bleeding - Pregnant Associated symptoms include nausea and vomiting. Pertinent negatives include no chills or fever.   Debra Gray is a 23 y.o. female  History of Present Illness The patient presents for evaluation of vaginal bleeding.  She has been experiencing vaginal bleeding since 01/06/2024, initially noticed as a drop in the toilet. The bleeding has since increased in volume and darkened in color, resembling a clot. She reports mild cramping but no sharp pain. Accompanying symptoms include nausea and vomiting, but she has not experienced any fevers or chills. She is not currently using pads for the bleeding, which she only observes when wiping after urination. She recalls feeling as though she had a urinary tract infection (UTI) when the bleeding first started at work on 01/06/2024, but this sensation subsided upon returning home. This is her first pregnancy. Her last menstrual period started on 11/23/2023. She has an upcoming appointment with her OB/GYN on 01/17/2024 and a consultation with her primary care physician on 01/15/2024.  GYNECOLOGICAL HISTORY: Last Menstrual Period: 11/23/2023    Medical History[1]  Current Medications[2]   ROS:    Review of Systems  Constitutional:  Negative for chills and fever.  Gastrointestinal:  Positive for nausea and vomiting.  Genitourinary:  Positive for pelvic pain and vaginal bleeding.     OBJECTIVE:    Vitals:   01/09/24 0923  BP: 111/70  BP Location: Right arm   Patient Position: Sitting  Pulse: 88  Resp: 16  Temp: 98.1 F (36.7 C)  TempSrc: Tympanic  SpO2: 99%  Weight: 59.9 kg (132 lb)  Height: 1.499 m (4' 11)     Physical Exam Constitutional:      Appearance: Normal appearance. She is normal weight.  HENT:     Head: Normocephalic and atraumatic.   Eyes:     Conjunctiva/sclera: Conjunctivae normal.   Pulmonary:     Effort: Pulmonary effort is normal.  Abdominal:     Tenderness: There is abdominal tenderness in the suprapubic area.     Comments:  Mild discomfort    Skin:    General: Skin is warm and dry.   Neurological:     Mental Status: She is alert.   Psychiatric:        Mood and Affect: Mood normal.        Behavior: Behavior normal.        Thought Content: Thought content normal.      ASSESSMENT/ PLAN:   1. Less than [redacted] weeks gestation of pregnancy (CMD)      2. Missed menses  POC HCG Qualitative, Urine    3. Vaginal bleeding      4. Vaginal cramping         Results for orders placed or performed in visit on 01/09/24  POC HCG Qualitative, Urine  Result Value Ref Range   HCG, Urine, POC Positive (A) Negative   Internal Control Acceptable    Kit/Device Lot # [id number]    Kit/Device Expiration Date 02/11/25  Discussed all point of care testing, images obtained in clinic, medical decision making, and follow up with patient and/or family prior to discharge. Discussed signs and symptoms for prompt return.   Assessment: Assessment & Plan 1. Vaginal bleeding. - Reports vaginal bleeding since Friday, initially appearing as a drop in the toilet and progressing to darker, clot-like bleeding. - Experiences mild cramping, nausea, and vomiting but no fevers or chills. Not using pads and only notices the bleeding when wiping after using the bathroom. - Given the positive pregnancy test and her symptoms, an intravaginal ultrasound and stat lab work are necessary to rule out a miscarriage. - Referred to [facility] for further evaluation. A printout of her positive pregnancy test and the address of the unit were provided.   Patient was seen in the clinic and deemed to have an acute or chronic illness that poses a threat to life or bodily function and as such I recommend patient proceed to the ED for further evaluation and management at a higher level of care than what I can provide in an Urgent Care setting.    The patient / parent / guardian was advised of signs and symptoms to return to clinic or go to ER with any change / worsening of symptoms.   Patient verbalized understanding and in agreement with current plan.  Patient currently stable for discharge home  Counseled regarding condition(s) and all patient questions/concerns were answered. If a new prescription was given today, then I discussed potential side effects, drug interactions, and instructions for taking the medication.   Patient Instructions  [facility] (maternity assessment unit) [address] Entrance [place],  [place]  [address]  I agree the documentation is accurate and complete.  Electronically signed by: Larraine Peri Sharps, PA-C 01/09/2024 9:54 AM  Note partially generated using DAX software.          [1] Past Medical History: Diagnosis Date  . Anxiety   [2]  Current Outpatient Medications:  .  chlordiazePOXIDE (LIBRIUM) 5 mg capsule, TAKE 1 CAPSULE BY MOUTH 2 TIMES PER DAY AS NEEDED FOR ANXIETY, Disp: , Rfl:  .  hydrOXYzine (ATARAX) 25 mg tablet, Take 1 tablet by mouth in the morning and 1 tablet at noon and 1 tablet in the evening. Take with meals. (Patient not taking: Reported on 01/09/2024), Disp: , Rfl:  .  traZODone (DESYREL) 100 mg tablet, Take 100 mg by mouth nightly as needed for sleep. (Patient not taking: Reported on 01/09/2024), Disp: , Rfl:

## 2024-01-09 NOTE — Telephone Encounter (Signed)
 Dr. Lola, patient will be scheduled as soon as possible.  Auth Submission: NO AUTH NEEDED Site of care: Site of care: [facility] Payer: [employer] and [employer] medicaid Medication & CPT/J Code(s) submitted: Venofer (Iron Sucrose) J1756 Diagnosis Code:  Route of submission (phone, fax, portal):  Phone # Fax # Auth type: Buy/Bill PB Units/visits requested: 500mg  x 2 doses Reference number:  Approval from: 01/09/24 to 05/11/24

## 2024-01-10 LAB — GC/CHLAMYDIA PROBE AMP (~~LOC~~) NOT AT ARMC
Chlamydia: NEGATIVE
Comment: NEGATIVE
Comment: NORMAL
Neisseria Gonorrhea: NEGATIVE

## 2024-01-15 DIAGNOSIS — Z3200 Encounter for pregnancy test, result unknown: Secondary | ICD-10-CM | POA: Diagnosis not present

## 2024-01-15 DIAGNOSIS — R0602 Shortness of breath: Secondary | ICD-10-CM | POA: Diagnosis not present

## 2024-01-15 DIAGNOSIS — Z0000 Encounter for general adult medical examination without abnormal findings: Secondary | ICD-10-CM | POA: Diagnosis not present

## 2024-01-24 ENCOUNTER — Other Ambulatory Visit: Payer: Self-pay

## 2024-01-24 ENCOUNTER — Ambulatory Visit

## 2024-01-24 ENCOUNTER — Encounter (HOSPITAL_COMMUNITY): Payer: Self-pay

## 2024-01-24 ENCOUNTER — Emergency Department (HOSPITAL_COMMUNITY)
Admission: EM | Admit: 2024-01-24 | Discharge: 2024-01-24 | Disposition: A | Discharge status: Home / Self Care | Attending: Emergency Medicine | Admitting: Emergency Medicine

## 2024-01-24 VITALS — BP 113/74 | HR 89 | Temp 98.4°F | Resp 16 | Ht 60.0 in | Wt 130.6 lb

## 2024-01-24 DIAGNOSIS — O99011 Anemia complicating pregnancy, first trimester: Secondary | ICD-10-CM

## 2024-01-24 DIAGNOSIS — Z3A08 8 weeks gestation of pregnancy: Secondary | ICD-10-CM | POA: Diagnosis not present

## 2024-01-24 DIAGNOSIS — T454X5A Adverse effect of iron and its compounds, initial encounter: Secondary | ICD-10-CM

## 2024-01-24 DIAGNOSIS — O26891 Other specified pregnancy related conditions, first trimester: Secondary | ICD-10-CM | POA: Diagnosis not present

## 2024-01-24 DIAGNOSIS — D649 Anemia, unspecified: Secondary | ICD-10-CM

## 2024-01-24 DIAGNOSIS — T7840XA Allergy, unspecified, initial encounter: Secondary | ICD-10-CM | POA: Diagnosis not present

## 2024-01-24 DIAGNOSIS — O99711 Diseases of the skin and subcutaneous tissue complicating pregnancy, first trimester: Secondary | ICD-10-CM | POA: Insufficient documentation

## 2024-01-24 DIAGNOSIS — L509 Urticaria, unspecified: Secondary | ICD-10-CM | POA: Insufficient documentation

## 2024-01-24 MED ORDER — ALBUTEROL SULFATE HFA 108 (90 BASE) MCG/ACT IN AERS: 2.0000 | INHALATION_SPRAY | Freq: Once | RESPIRATORY_TRACT | Status: DC | PRN

## 2024-01-24 MED ORDER — EPINEPHRINE 0.3 MG/0.3ML IJ SOAJ: 0.3000 mg | Freq: Once | INTRAMUSCULAR | Status: DC | PRN

## 2024-01-24 MED ORDER — FAMOTIDINE IN NACL 20-0.9 MG/50ML-% IV SOLN
20.0000 mg | Freq: Once | INTRAVENOUS | Status: AC
  Administered 2024-01-24 (×2): 20 mg via INTRAVENOUS
  Filled 2024-01-24: qty 50

## 2024-01-24 MED ORDER — SODIUM CHLORIDE 0.9 % IV BOLUS
1000.0000 mL | Freq: Once | INTRAVENOUS | Status: AC
  Administered 2024-01-24 (×2): 1000 mL via INTRAVENOUS

## 2024-01-24 MED ORDER — SODIUM CHLORIDE 0.9 % IV SOLN: INTRAVENOUS | Status: DC

## 2024-01-24 MED ORDER — DIPHENHYDRAMINE HCL 50 MG/ML IJ SOLN
50.0000 mg | Freq: Once | INTRAMUSCULAR | Status: AC | PRN
Start: 2024-01-24 — End: 2024-01-24
  Administered 2024-01-24 (×2): 50 mg via INTRAVENOUS

## 2024-01-24 MED ORDER — DIPHENHYDRAMINE HCL 50 MG/ML IJ SOLN
25.0000 mg | Freq: Once | INTRAMUSCULAR | Status: AC
  Administered 2024-01-24 (×2): 25 mg via INTRAVENOUS
  Filled 2024-01-24: qty 1

## 2024-01-24 MED ORDER — METHYLPREDNISOLONE SODIUM SUCC 125 MG IJ SOLR
125.0000 mg | Freq: Once | INTRAMUSCULAR | Status: AC | PRN
  Administered 2024-01-24 (×2): 125 mg via INTRAVENOUS

## 2024-01-24 MED ORDER — SODIUM CHLORIDE 0.9 % IV SOLN: Freq: Once | INTRAVENOUS | Status: AC | PRN

## 2024-01-24 MED ORDER — SODIUM CHLORIDE 0.9 % IV SOLN
500.0000 mg | Freq: Once | INTRAVENOUS | Status: AC
  Administered 2024-01-24 (×2): 500 mg via INTRAVENOUS
  Filled 2024-01-24: qty 25

## 2024-01-24 MED ORDER — METHYLPREDNISOLONE SODIUM SUCC 40 MG IJ SOLR
40.0000 mg | Freq: Once | INTRAMUSCULAR | Status: AC
  Administered 2024-01-24 (×2): 40 mg via INTRAVENOUS
  Filled 2024-01-24: qty 1

## 2024-01-24 MED ORDER — FAMOTIDINE IN NACL 20-0.9 MG/50ML-% IV SOLN: 20.0000 mg | Freq: Once | INTRAVENOUS | Status: DC | PRN

## 2024-01-24 MED ORDER — ONDANSETRON HCL 4 MG/2ML IJ SOLN
4.0000 mg | Freq: Once | INTRAMUSCULAR | Status: AC
  Administered 2024-01-24 (×2): 4 mg via INTRAVENOUS
  Filled 2024-01-24: qty 2

## 2024-01-24 NOTE — Progress Notes (Addendum)
 Diagnosis: Acute Anemia  Provider:  Mannam, Praveen MD  Procedure: IV Infusion  IV Type: Peripheral, IV Location: R Antecubital  Venofer  (Iron  Sucrose), Dose: 500 mg  Infusion Start Time: 0857  Infusion Stop Time: 1207  Post Infusion IV Care: Patient had symptoms of a reaction. See note below.  Discharge: Condition: Stable, Destination: [facility]  Performed by:  Rocky FORBES Sar, RN   At 1207, patient complained of symptoms including generalized cramping and burning in bilateral feet. Within approximately 5 minutes, nursing staff observed hives on bilateral arms. Patient also c/o lower and middle back pain. Emergency protocols initiated and emergency medications administered including Benadryl  50 mg IVP at 1209, Solumedrol 125 mg IVP at 1208, and NS 1000 mL bolus at 1208 . Vital signs stable. EMS called at 1217 and arrived at 1230. Patient stated the cramping had improved, but other symptoms had not changed. Ordering provider Dr. Donnice Carolus notified via secure message at 1213. No initial response.  Dr. Carolus did respond at 1319; provider aware patient was in ED. Patient was transported to [facility] via EMS.   On 02/09/24, Dr. Carolus confirmed via secure message that second infusion appointment should be cancelled.

## 2024-01-24 NOTE — ED Notes (Signed)
 Got patient into a gown on the monitor did EKG shown to er provider patient is resting with call bell in reach

## 2024-01-24 NOTE — ED Triage Notes (Signed)
 Pt arrives to ED via EMS  from a doctors office after having an allergic reaction while getting her first iron  infusion. Pt was noted to have visible hives on arms and lower back and became ill feeling. The office immediatly stopped the infusion administered IV benadryl  and solu medrol .

## 2024-01-24 NOTE — ED Provider Notes (Signed)
 [place] EMERGENCY DEPARTMENT AT [facility] Provider Note   CSN: [id number] Arrival date & time: 01/24/24  1312     Patient presents with: Allergic Reaction   Debra Gray is a 23 y.o. female.    Allergic Reaction    23 year old G1P0 female 8 weeks 6 days pregnant by LMP who presents to the emergency department with concern for an allergic reaction to IV iron .  The patient was being administered IV iron  in clinic earlier today when she had symptoms of a reaction.  She states that she developed hives with associated nausea and vomiting.  She was administered IV Benadryl  and Solu-Medrol  and was not administered epinephrine .  Her hives have resolved.  Endorses paresthesias in her extremities in the setting of hyperventilating.  Prior to Admission medications   Medication Sig Start Date End Date Taking? Authorizing Provider  busPIRone (BUSPAR) 5 MG tablet Take 5 mg by mouth 3 (three) times daily.    [provider]  cyclobenzaprine  (FLEXERIL ) 10 MG tablet Take 1 tablet (10 mg total) by mouth 2 (two) times daily as needed for muscle spasms. 12/11/21   Rising, Asberry, PA-C  eletriptan  (RELPAX ) 40 MG tablet PLEASE SEE ATTACHED FOR DETAILED DIRECTIONS 09/03/21   Rush Nest, MD  Ferric Maltol  (ACCRUFER ) 30 MG CAPS Take 1 capsule (30 mg total) by mouth daily. 01/09/24 04/08/24  Lorrane Pac, MD  ondansetron  (ZOFRAN ) 4 MG tablet Take 1 tablet (4 mg total) by mouth every 8 (eight) hours as needed for nausea or vomiting. 04/07/22   Gladis Mary-Margaret, FNP  propranolol  (INDERAL ) 20 MG tablet Take 1 tablet (20 mg total) by mouth 2 (two) times daily. 08/26/21   Rush Nest, MD    Allergies: Iron  sucrose    Review of Systems  Unable to perform ROS: Acuity of condition    Updated Vital Signs BP 108/64   Pulse 95   Temp 98.6 F (37 C) (Oral)   Resp 18   LMP 11/23/2023   SpO2 100%   Physical Exam Vitals and nursing note reviewed.  Constitutional:       Appearance: She is not diaphoretic.  HENT:     Head: Normocephalic and atraumatic.  Eyes:     Conjunctiva/sclera: Conjunctivae normal.     Pupils: Pupils are equal, round, and reactive to light.  Cardiovascular:     Rate and Rhythm: Regular rhythm. Tachycardia present.  Pulmonary:     Effort: Pulmonary effort is normal. No respiratory distress.  Abdominal:     General: There is no distension.     Tenderness: There is no guarding.  Musculoskeletal:        General: No deformity or signs of injury.     Cervical back: Neck supple.  Skin:    Findings: No lesion or rash.  Neurological:     General: No focal deficit present.     Mental Status: She is alert. Mental status is at baseline.  Psychiatric:        Mood and Affect: Mood is anxious.     (all labs ordered are listed, but only abnormal results are displayed) Labs Reviewed  HCG, SERUM, QUALITATIVE  BASIC METABOLIC PANEL WITH GFR  CBC    EKG: EKG Interpretation Date/Time:  Tuesday January 24 2024 13:17:53 EDT Ventricular Rate:  103 PR Interval:  93 QRS Duration:  71 QT Interval:  326 QTC Calculation: 427 R Axis:   69  Text Interpretation: Sinus tachycardia Confirmed by Jerrol Agent ([id number]) on 01/24/2024 2:32:07  PM  Radiology: No results found.   Procedures   Medications Ordered in the ED  sodium chloride  0.9 % bolus 1,000 mL (1,000 mLs Intravenous New Bag/Given 01/24/24 1433)    And  0.9 %  sodium chloride  infusion (has no administration in time range)  famotidine  (PEPCID ) IVPB 20 mg premix (20 mg Intravenous New Bag/Given 01/24/24 1430)  diphenhydrAMINE  (BENADRYL ) injection 25 mg (25 mg Intravenous Given 01/24/24 1426)  ondansetron  (ZOFRAN ) injection 4 mg (4 mg Intravenous Given 01/24/24 1427)  methylPREDNISolone  sodium succinate (SOLU-MEDROL ) 40 mg/mL injection 40 mg (40 mg Intravenous Given 01/24/24 1427)                                    Medical Decision Making Amount and/or Complexity of Data  Reviewed Labs: ordered.  Risk Prescription drug management.    23 year old G1P0 female 8 weeks 6 days pregnant by LMP who presents to the emergency department with concern for an allergic reaction to IV iron .  The patient was being administered IV iron  in clinic earlier today when she had symptoms of a reaction.  She states that she developed hives with associated nausea and vomiting.  She was administered IV Benadryl  and Solu-Medrol  and was not administered epinephrine .  Her hives have resolved.  Endorses paresthesias in her extremities in the setting of hyperventilating.  On arrival, the patient was afebrile, initially tachycardic heart rate 101, RR 16, BP 120/74, saturating 100% on room air.  Patient presenting with hives and nausea and vomiting in the setting of IV iron  infusion.  Status post Solu-Medrol  and Benadryl .  Patient hives have resolved and symptoms appear to be improving.  Will hold on epinephrine  administration at this time given improvement in patient's symptoms.  Favor allergic reaction versus anaphylaxis.  IV access is obtained and the patient was administered IV fluid bolus, IV Solu-Medrol , Benadryl , Pepcid  and Zofran .  On repeat assessment, the patient had no signs of anaphylaxis, blood pressure stable, no sensation of oropharyngeal swelling, no stridor, lungs CTAB without wheezing, nausea and vomiting had resolved.  No hives appreciated on exam.  Prior to a repeat evaluation, I was informed by nursing that the patient eloped from the emergency department.     Final diagnoses:  Allergic reaction, initial encounter    ED Discharge Orders     None          Jerrol Agent, MD 01/24/24 1614

## 2024-01-24 NOTE — ED Notes (Signed)
 Patient Right Arm is visibly swollen and sensitive to touch. Patient still willing to leave AMA after risks explained.

## 2024-02-09 ENCOUNTER — Ambulatory Visit

## 2024-02-10 DIAGNOSIS — N912 Amenorrhea, unspecified: Secondary | ICD-10-CM | POA: Diagnosis not present

## 2024-02-24 DIAGNOSIS — Z3490 Encounter for supervision of normal pregnancy, unspecified, unspecified trimester: Secondary | ICD-10-CM | POA: Diagnosis not present

## 2024-02-24 DIAGNOSIS — B3731 Acute candidiasis of vulva and vagina: Secondary | ICD-10-CM | POA: Diagnosis not present

## 2024-02-24 DIAGNOSIS — Z862 Personal history of diseases of the blood and blood-forming organs and certain disorders involving the immune mechanism: Secondary | ICD-10-CM | POA: Diagnosis not present

## 2024-02-24 DIAGNOSIS — Z3A13 13 weeks gestation of pregnancy: Secondary | ICD-10-CM | POA: Diagnosis not present

## 2024-02-24 DIAGNOSIS — Z113 Encounter for screening for infections with a predominantly sexual mode of transmission: Secondary | ICD-10-CM | POA: Diagnosis not present

## 2024-02-24 DIAGNOSIS — Z124 Encounter for screening for malignant neoplasm of cervix: Secondary | ICD-10-CM | POA: Diagnosis not present

## 2024-02-24 DIAGNOSIS — N898 Other specified noninflammatory disorders of vagina: Secondary | ICD-10-CM | POA: Diagnosis not present

## 2024-02-24 DIAGNOSIS — O26891 Other specified pregnancy related conditions, first trimester: Secondary | ICD-10-CM | POA: Diagnosis not present

## 2024-02-24 DIAGNOSIS — O2610 Low weight gain in pregnancy, unspecified trimester: Secondary | ICD-10-CM | POA: Diagnosis not present

## 2024-02-24 DIAGNOSIS — O23599 Infection of other part of genital tract in pregnancy, unspecified trimester: Secondary | ICD-10-CM | POA: Diagnosis not present

## 2024-02-27 DIAGNOSIS — Z369 Encounter for antenatal screening, unspecified: Secondary | ICD-10-CM | POA: Diagnosis not present

## 2024-05-16 DIAGNOSIS — Z3A25 25 weeks gestation of pregnancy: Secondary | ICD-10-CM | POA: Diagnosis not present

## 2024-05-16 DIAGNOSIS — Z363 Encounter for antenatal screening for malformations: Secondary | ICD-10-CM | POA: Diagnosis not present
